# Patient Record
Sex: Female | Born: 1985 | Race: Black or African American | Hispanic: No | Marital: Single | Smoking: Never smoker
Health system: Southern US, Community
[De-identification: ages and names within clinical notes are randomized; demographics above are authoritative.]

## PROBLEM LIST (undated history)

## (undated) DIAGNOSIS — T7840XA Allergy, unspecified, initial encounter: Secondary | ICD-10-CM

## (undated) DIAGNOSIS — E282 Polycystic ovarian syndrome: Secondary | ICD-10-CM

## (undated) HISTORY — PX: NO PAST SURGERIES: SHX2092

## (undated) HISTORY — DX: Allergy, unspecified, initial encounter: T78.40XA

---

## 2007-03-05 ENCOUNTER — Emergency Department: Payer: Self-pay | Admitting: Emergency Medicine

## 2007-04-29 ENCOUNTER — Emergency Department: Payer: Self-pay | Admitting: Emergency Medicine

## 2007-07-03 ENCOUNTER — Emergency Department: Payer: Self-pay | Admitting: Emergency Medicine

## 2007-09-12 ENCOUNTER — Emergency Department: Payer: Self-pay | Admitting: Emergency Medicine

## 2007-09-14 ENCOUNTER — Emergency Department: Payer: Self-pay | Admitting: Emergency Medicine

## 2007-09-18 ENCOUNTER — Emergency Department (HOSPITAL_COMMUNITY): Admission: EM | Admit: 2007-09-18 | Discharge: 2007-09-19 | Payer: Self-pay | Admitting: Emergency Medicine

## 2007-09-20 ENCOUNTER — Ambulatory Visit: Payer: Self-pay | Admitting: Unknown Physician Specialty

## 2007-09-26 ENCOUNTER — Ambulatory Visit: Payer: Self-pay | Admitting: Unknown Physician Specialty

## 2007-10-31 ENCOUNTER — Emergency Department: Payer: Self-pay | Admitting: Emergency Medicine

## 2007-12-08 ENCOUNTER — Emergency Department: Payer: Self-pay | Admitting: Emergency Medicine

## 2007-12-28 ENCOUNTER — Emergency Department: Payer: Self-pay | Admitting: Emergency Medicine

## 2008-02-04 ENCOUNTER — Emergency Department: Payer: Self-pay | Admitting: Emergency Medicine

## 2008-08-13 ENCOUNTER — Emergency Department: Payer: Self-pay | Admitting: Emergency Medicine

## 2009-01-30 ENCOUNTER — Emergency Department: Payer: Self-pay | Admitting: Internal Medicine

## 2009-03-27 ENCOUNTER — Ambulatory Visit: Payer: Self-pay | Admitting: General Practice

## 2009-04-24 ENCOUNTER — Ambulatory Visit: Payer: Self-pay | Admitting: General Practice

## 2009-06-25 ENCOUNTER — Emergency Department: Payer: Self-pay | Admitting: Emergency Medicine

## 2009-09-12 ENCOUNTER — Emergency Department: Payer: Self-pay | Admitting: Emergency Medicine

## 2010-06-21 ENCOUNTER — Emergency Department: Payer: Self-pay | Admitting: Emergency Medicine

## 2010-11-07 LAB — GC/CHLAMYDIA PROBE AMP, GENITAL
Chlamydia, DNA Probe: NEGATIVE
GC Probe Amp, Genital: NEGATIVE

## 2010-11-07 LAB — DIFFERENTIAL
Basophils Absolute: 0
Basophils Relative: 0
Eosinophils Absolute: 0.1
Eosinophils Relative: 1
Lymphocytes Relative: 42
Lymphs Abs: 3.9
Monocytes Absolute: 0.6
Monocytes Relative: 7
Neutro Abs: 4.6
Neutrophils Relative %: 50

## 2010-11-07 LAB — CBC
HCT: 35.7 — ABNORMAL LOW
Hemoglobin: 12.2
MCHC: 34.3
MCV: 93.6
Platelets: 275
RBC: 3.81 — ABNORMAL LOW
RDW: 14.4
WBC: 9.2

## 2010-11-07 LAB — WET PREP, GENITAL
Clue Cells Wet Prep HPF POC: NONE SEEN
Trich, Wet Prep: NONE SEEN
Yeast Wet Prep HPF POC: NONE SEEN

## 2010-11-07 LAB — URINALYSIS, ROUTINE W REFLEX MICROSCOPIC
Bilirubin Urine: NEGATIVE
Glucose, UA: NEGATIVE
Hgb urine dipstick: NEGATIVE
Ketones, ur: NEGATIVE
Nitrite: NEGATIVE
Protein, ur: NEGATIVE
Specific Gravity, Urine: 1.02
Urobilinogen, UA: 1
pH: 7

## 2010-11-07 LAB — HCG, QUANTITATIVE, PREGNANCY: hCG, Beta Chain, Quant, S: 3102 — ABNORMAL HIGH

## 2010-11-07 LAB — PREGNANCY, URINE: Preg Test, Ur: POSITIVE

## 2010-11-07 LAB — ABO/RH: ABO/RH(D): O POS

## 2013-03-21 ENCOUNTER — Emergency Department: Payer: Self-pay | Admitting: Emergency Medicine

## 2013-03-21 LAB — CBC
HCT: 35.3 % (ref 35.0–47.0)
HGB: 12.3 g/dL (ref 12.0–16.0)
MCH: 33.6 pg (ref 26.0–34.0)
MCHC: 34.7 g/dL (ref 32.0–36.0)
MCV: 97 fL (ref 80–100)
Platelet: 235 10*3/uL (ref 150–440)
RBC: 3.65 10*6/uL — ABNORMAL LOW (ref 3.80–5.20)
RDW: 14.1 % (ref 11.5–14.5)
WBC: 9.5 10*3/uL (ref 3.6–11.0)

## 2014-05-17 ENCOUNTER — Encounter: Payer: Self-pay | Admitting: Podiatry

## 2014-05-17 ENCOUNTER — Other Ambulatory Visit: Payer: Self-pay | Admitting: *Deleted

## 2014-05-17 ENCOUNTER — Ambulatory Visit (INDEPENDENT_AMBULATORY_CARE_PROVIDER_SITE_OTHER): Payer: Commercial Managed Care - PPO

## 2014-05-17 ENCOUNTER — Ambulatory Visit (INDEPENDENT_AMBULATORY_CARE_PROVIDER_SITE_OTHER): Payer: Commercial Managed Care - PPO | Admitting: Podiatry

## 2014-05-17 VITALS — BP 117/87 | HR 92 | Resp 16 | Ht 66.0 in | Wt 300.0 lb

## 2014-05-17 DIAGNOSIS — M722 Plantar fascial fibromatosis: Secondary | ICD-10-CM | POA: Diagnosis not present

## 2014-05-17 MED ORDER — TRIAMCINOLONE ACETONIDE 10 MG/ML IJ SUSP
10.0000 mg | Freq: Once | INTRAMUSCULAR | Status: AC
  Administered 2014-05-17: 10 mg

## 2014-05-17 MED ORDER — MELOXICAM 15 MG PO TABS: 15.0000 mg | ORAL_TABLET | Freq: Every day | ORAL | Status: DC

## 2014-05-17 NOTE — Patient Instructions (Signed)

## 2014-05-17 NOTE — Progress Notes (Signed)
   Subjective:    Patient ID: Beth Watts, female    DOB: [birth year], 29 y.o.   MRN: [id number]  HPI  29 year old female presents the office with complaints of bilateral heel pain with the right side greater than left. She states that his been ongoing for possibly 6 months. She states of prostate 3 years ago she was diagnosed with plantar fasciitis by her primary care physician. She states that she tried stretching activities and icing at that time which relieved the symptoms. Over the last 6 months she states the pain has been progressive. She states that she has pain to the heels in the morning or after periods of rest which is relieved by ambulation. She denies any numbness to the area. The pain does not wake her up at night. She denies any history of injury or trauma to the area and denies any change or increased activity the time of onset of symptoms. No other complaints at this time.  Review of Systems  HENT: Positive for congestion, ear pain and sinus pressure.   Musculoskeletal: Positive for back pain.  All other systems reviewed and are negative.      Objective:   Physical Exam AAO x3, NAD DP/PT pulses palpable bilaterally, CRT less than 3 seconds Protective sensation intact with Simms Weinstein monofilament, vibratory sensation intact, Achilles tendon reflex intact Tenderness to palpation overlying the plantar medial tubercle of the calcaneus to bilatertal heels at the insertion of the plantar fascia. There is mild pain along the course of the medial band of the plantar fascial within the arch of the foot. The plantar fascia appears intact. There is no pain with lateral compression of the calcaneus or pain the vibratory sensation. No pain on the posterior aspect of the calcaneus or along the course/insertion of the Achilles tendon. There is no overlying edema, erythema, increase in warmth. No other areas of tenderness palpation or pain with vibratory sensation to the foot/ankle.  Decrease in medial arch height upon weightbearing the equinus present. MMT 5/5, ROM WNL No open lesions or pre-ulcerative lesions are identified. No pain with calf compression, swelling, warmth, erythema.     Assessment & Plan:  29 year old female with bilateral plantar fasciitis, flatfoot -X-rays were obtained and reviewed the patient -Treatment options were discussed including alternatives, risks, complications -Patient elects to proceed with steroid injection into the bilateral heels. Under sterile skin preparation, a total of 2.5cc of kenalog 10, 0.5% Marcaine plain, and 2% lidocaine plain were infiltrated into the symptomatic area without complication. A band-aid was applied. Patient tolerated the injection well without complication. Post-injection care with discussed with the patient. Discussed with the patient to ice the area over the next couple of days to help prevent a steroid flare.  -Plantar fascial brace was dispensed 2 -Discussed ice and stretching activities -Discussed shoe gear modifications. I do not do barefoot even at home. -Discussed orthotics. This time should proceed with custom orthotics. The patient was scanned for [facility] -Follow-up in 3 weeks or sooner if any problems are to arise. In the meantime encouraged to call the office with any questions, concerns, changes symptoms. Follow-up with PCP for other issues mentioned in the review of systems.

## 2014-06-07 ENCOUNTER — Ambulatory Visit: Payer: Commercial Managed Care - PPO | Admitting: Podiatry

## 2014-12-19 ENCOUNTER — Inpatient Hospital Stay (HOSPITAL_COMMUNITY)
Admission: AD | Admit: 2014-12-19 | Discharge: 2014-12-20 | Disposition: A | Discharge status: Home / Self Care | Payer: 59 | Source: Ambulatory Visit | Attending: Obstetrics & Gynecology | Admitting: Obstetrics & Gynecology

## 2014-12-19 ENCOUNTER — Encounter (HOSPITAL_COMMUNITY): Payer: Self-pay | Admitting: *Deleted

## 2014-12-19 DIAGNOSIS — N926 Irregular menstruation, unspecified: Secondary | ICD-10-CM | POA: Diagnosis not present

## 2014-12-19 DIAGNOSIS — N939 Abnormal uterine and vaginal bleeding, unspecified: Secondary | ICD-10-CM

## 2014-12-19 DIAGNOSIS — R319 Hematuria, unspecified: Secondary | ICD-10-CM | POA: Insufficient documentation

## 2014-12-19 HISTORY — DX: Polycystic ovarian syndrome: E28.2

## 2014-12-19 NOTE — MAU Note (Signed)
Pt states she has been having bleeding when she urinates and is cramping for 2 weeks.  She also complains of swelling in her ankles and feet.  Pt states she gets out of breath quickly.

## 2014-12-20 DIAGNOSIS — N898 Other specified noninflammatory disorders of vagina: Secondary | ICD-10-CM

## 2014-12-20 LAB — CBC
HCT: 34.8 % — ABNORMAL LOW (ref 36.0–46.0)
Hemoglobin: 11.4 g/dL — ABNORMAL LOW (ref 12.0–15.0)
MCH: 30.1 pg (ref 26.0–34.0)
MCHC: 32.8 g/dL (ref 30.0–36.0)
MCV: 91.8 fL (ref 78.0–100.0)
Platelets: 310 10*3/uL (ref 150–400)
RBC: 3.79 MIL/uL — ABNORMAL LOW (ref 3.87–5.11)
RDW: 14.9 % (ref 11.5–15.5)
WBC: 7.8 10*3/uL (ref 4.0–10.5)

## 2014-12-20 LAB — URINALYSIS, ROUTINE W REFLEX MICROSCOPIC
Bilirubin Urine: NEGATIVE
Glucose, UA: NEGATIVE mg/dL
Ketones, ur: NEGATIVE mg/dL
Leukocytes, UA: NEGATIVE
Nitrite: NEGATIVE
Protein, ur: NEGATIVE mg/dL
Specific Gravity, Urine: 1.025 (ref 1.005–1.030)
Urobilinogen, UA: 0.2 mg/dL (ref 0.0–1.0)
pH: 6 (ref 5.0–8.0)

## 2014-12-20 LAB — GC/CHLAMYDIA PROBE AMP (~~LOC~~) NOT AT ARMC
Chlamydia: NEGATIVE
Neisseria Gonorrhea: NEGATIVE

## 2014-12-20 LAB — URINE MICROSCOPIC-ADD ON: WBC, UA: NONE SEEN WBC/hpf (ref <3)

## 2014-12-20 LAB — WET PREP, GENITAL
Clue Cells Wet Prep HPF POC: NONE SEEN
Trich, Wet Prep: NONE SEEN
WBC, Wet Prep HPF POC: NONE SEEN
Yeast Wet Prep HPF POC: NONE SEEN

## 2014-12-20 LAB — POCT PREGNANCY, URINE: Preg Test, Ur: NEGATIVE

## 2014-12-20 NOTE — MAU Provider Note (Signed)
History     CSN: [id number]  Arrival date and time: 12/19/14 2334   First Provider Initiated Contact with Patient 12/20/14 0036      Chief Complaint  Patient presents with  . Hematuria  . Leg Swelling   Vaginal Bleeding The patient's primary symptoms include vaginal bleeding. This is a new problem. The current episode started 1 to 4 weeks ago. The problem occurs intermittently. The problem has been unchanged. The pain is moderate. The problem affects both sides. She is not pregnant. Associated symptoms include abdominal pain and urgency. Pertinent negatives include no constipation, diarrhea, dysuria, fever, frequency, nausea or vomiting. The vaginal discharge was bloody. The vaginal bleeding is spotting (only present when she voids. Unsure if blood is in her urine or vaginal bleeding. ). She has not been passing clots. She has not been passing tissue. Nothing aggravates the symptoms. She has tried nothing for the symptoms. She uses nothing for contraception.   Past Medical History  Diagnosis Date  . Allergy   . PCOS (polycystic ovarian syndrome)     Past Surgical History  Procedure Laterality Date  . No past surgeries      History reviewed. No pertinent family history.  Social History  Substance Use Topics  . Smoking status: Never Smoker   . Smokeless tobacco: None  . Alcohol Use: 0.0 oz/week    0 Standard drinks or equivalent per week    Allergies:  Allergies  Allergen Reactions  . Latex Rash    Prescriptions prior to admission  Medication Sig Dispense Refill Last Dose  . amoxicillin (AMOXIL) 500 MG capsule      . meloxicam (MOBIC) 15 MG tablet Take 1 tablet (15 mg total) by mouth daily. 30 tablet 2     Review of Systems  Constitutional: Negative for fever.  Gastrointestinal: Positive for abdominal pain. Negative for nausea, vomiting, diarrhea and constipation.  Genitourinary: Positive for urgency and vaginal bleeding. Negative for dysuria and frequency.    Physical Exam   Blood pressure 117/61, pulse 76, temperature 97.9 F (36.6 C), temperature source Oral, resp. rate 18, last menstrual period 11/28/2014.  Physical Exam  Nursing note and vitals reviewed. Constitutional: She is oriented to person, place, and time. She appears well-developed and well-nourished. No distress.  HENT:  Head: Normocephalic.  Cardiovascular: Normal rate.   Respiratory: Effort normal.  GI: Soft. There is no tenderness. There is no rebound.  Genitourinary:   External: no lesion Vagina: small amount of pink discharge Cervix: pink, smooth, no CMT Uterus: NSSC Adnexa: NT   Neurological: She is alert and oriented to person, place, and time.  Skin: Skin is warm and dry.  Psychiatric: She has a normal mood and affect.   Results for orders placed or performed during the hospital encounter of 12/19/14 (from the past 24 hour(s))  Urinalysis, Routine w reflex microscopic (not [facility])     Status: Abnormal   Collection Time: 12/19/14 11:45 PM  Result Value Ref Range   Color, Urine YELLOW YELLOW   APPearance CLEAR CLEAR   Specific Gravity, Urine 1.025 1.005 - 1.030   pH 6.0 5.0 - 8.0   Glucose, UA NEGATIVE NEGATIVE mg/dL   Hgb urine dipstick TRACE (A) NEGATIVE   Bilirubin Urine NEGATIVE NEGATIVE   Ketones, ur NEGATIVE NEGATIVE mg/dL   Protein, ur NEGATIVE NEGATIVE mg/dL   Urobilinogen, UA 0.2 0.0 - 1.0 mg/dL   Nitrite NEGATIVE NEGATIVE   Leukocytes, UA NEGATIVE NEGATIVE  Urine microscopic-add on  Status: Abnormal   Collection Time: 12/19/14 11:45 PM  Result Value Ref Range   Squamous Epithelial / LPF FEW (A) RARE   WBC, UA  <3 WBC/hpf    NO FORMED ELEMENTS SEEN ON URINE MICROSCOPIC EXAMINATION   RBC / HPF 0-2 <3 RBC/hpf   Bacteria, UA FEW (A) RARE  Pregnancy, urine POC     Status: None   Collection Time: 12/20/14 12:05 AM  Result Value Ref Range   Preg Test, Ur NEGATIVE NEGATIVE  Wet prep, genital     Status: None   Collection Time: 12/20/14  12:38 AM  Result Value Ref Range   Yeast Wet Prep HPF POC NONE SEEN NONE SEEN   Trich, Wet Prep NONE SEEN NONE SEEN   Clue Cells Wet Prep HPF POC NONE SEEN NONE SEEN   WBC, Wet Prep HPF POC NONE SEEN NONE SEEN  CBC     Status: Abnormal   Collection Time: 12/20/14  1:07 AM  Result Value Ref Range   WBC 7.8 4.0 - 10.5 K/uL   RBC 3.79 (L) 3.87 - 5.11 MIL/uL   Hemoglobin 11.4 (L) 12.0 - 15.0 g/dL   HCT 14.7 (L) 82.9 - 56.2 %   MCV 91.8 78.0 - 100.0 fL   MCH 30.1 26.0 - 34.0 pg   MCHC 32.8 30.0 - 36.0 g/dL   RDW 13.0 86.5 - 78.4 %   Platelets 310 150 - 400 K/uL     MAU Course  Procedures  MDM   Assessment and Plan   1. Vaginal spotting   2. Irregular menstrual cycle    DC home Comfort measures reviewed  Bleeding precautions Menstrual calendar  RX: none  Return to MAU as needed   Follow-up Information    Please follow up.   Why:  If symptoms worsen   Contact information:   Your primary care doctor.         Tawnya Crook 12/20/2014, 12:39 AM

## 2014-12-21 LAB — URINE CULTURE: Culture: 8000

## 2014-12-21 LAB — RPR: RPR Ser Ql: NONREACTIVE

## 2014-12-21 LAB — HIV ANTIBODY (ROUTINE TESTING W REFLEX): HIV Screen 4th Generation wRfx: NONREACTIVE

## 2016-02-18 ENCOUNTER — Telehealth: Payer: Self-pay | Admitting: Podiatry

## 2016-02-18 NOTE — Telephone Encounter (Signed)
Left vm for pt to call our office about orthotics that are from 2016

## 2018-01-05 ENCOUNTER — Encounter (HOSPITAL_COMMUNITY): Payer: Self-pay

## 2018-01-05 ENCOUNTER — Ambulatory Visit (HOSPITAL_COMMUNITY)
Admission: EM | Admit: 2018-01-05 | Discharge: 2018-01-05 | Disposition: A | Discharge status: Home / Self Care | Payer: 59 | Attending: Family Medicine | Admitting: Family Medicine

## 2018-01-05 DIAGNOSIS — G43009 Migraine without aura, not intractable, without status migrainosus: Secondary | ICD-10-CM

## 2018-01-05 MED ORDER — SUMATRIPTAN SUCCINATE 6 MG/0.5ML ~~LOC~~ SOLN
SUBCUTANEOUS | Status: AC
  Filled 2018-01-05: qty 0.5

## 2018-01-05 MED ORDER — KETOROLAC TROMETHAMINE 60 MG/2ML IM SOLN
INTRAMUSCULAR | Status: AC
  Filled 2018-01-05: qty 2

## 2018-01-05 MED ORDER — SUMATRIPTAN SUCCINATE 6 MG/0.5ML ~~LOC~~ SOLN
6.0000 mg | Freq: Once | SUBCUTANEOUS | Status: AC
  Administered 2018-01-05: 6 mg via SUBCUTANEOUS

## 2018-01-05 MED ORDER — ONDANSETRON 4 MG PO TBDP
ORAL_TABLET | ORAL | Status: AC
  Filled 2018-01-05: qty 1

## 2018-01-05 MED ORDER — KETOROLAC TROMETHAMINE 60 MG/2ML IM SOLN
60.0000 mg | Freq: Once | INTRAMUSCULAR | Status: AC
  Administered 2018-01-05: 60 mg via INTRAMUSCULAR

## 2018-01-05 MED ORDER — ONDANSETRON 4 MG PO TBDP
4.0000 mg | ORAL_TABLET | Freq: Once | ORAL | Status: AC
  Administered 2018-01-05: 4 mg via ORAL

## 2018-01-05 NOTE — ED Triage Notes (Signed)
Pt present that she has a severe headache that started this am, she took [place] powder this am Along with a  Sumatriptan 50mg 

## 2018-01-05 NOTE — ED Provider Notes (Signed)
[facility]   [id number] 01/05/18 Arrival Time: 4098  ASSESSMENT & PLAN:  1. Migraine without aura and without status migrainosus, not intractable    Meds ordered this encounter  Medications  . ketorolac (TORADOL) injection 60 mg  . SUMAtriptan (IMITREX) injection 6 mg  . ondansetron (ZOFRAN-ODT) disintegrating tablet 4 mg   Reports that she is beginning to feel better after above medications given. No sign of neurological emergency. No indication for hospital evaluation at this time. Normal neurological exam. Reassured. D/C to home for rest and observation. Headache precautions given on AVS.  Follow-up Information    [facility] EMERGENCY DEPARTMENT.   Specialty:  Emergency Medicine Why:  If your headache does not go away or if your headache worsens. Contact information: [address] [id number] mc [place] [address] [phone]         Reviewed expectations re: course of current medical issues. Questions answered. Outlined signs and symptoms indicating need for more acute intervention. Patient verbalized understanding. After Visit Summary given.   SUBJECTIVE:  Beth Watts is a 32 y.o. female who presents with complaint of a migraine without aura. Reports fairly abrupt onset this morning after wakin. Location: frontal (L>R) without radiation. History of headaches: yes; occasional migraines that usu respond to sumatriptan and OTC Goody powder and rest/sleep. Took these this morning but unable to fall asleep. Precipitating factors include: none which have been determined. Associated symptoms: Preceding aura: no. Nausea/vomiting: mild nausea without emesis. Vision changes: initially her L eye was blurry but this resolved quickly and has not returned. Increased sensitivity to light and to noises: yes. Fever: no. Sinus pressure/congestion: no. No extremity sensation changes or weakness. Current headache does limit  normal daily activities; could not go to work. Ambulatory without difficulty. Drove herself here. The patient denies depression, dizziness, loss of balance, muscle weakness and speech difficulties. No head injuries reported. Overall these symptoms typical of her previous migraines, "just worse." Not the worst headache of her life.  ROS: As per HPI. All other systems negative.   OBJECTIVE:  Vitals:   01/05/18 0959  BP: 120/76  Pulse: 80  Resp: 18  Temp: (!) 97.5 F (36.4 C)  TempSrc: Oral  SpO2: 99%    General appearance: alert; no fatigued (prefers to sit in darkened room) Eyes: PERRLA; EOMI; conjunctiva normal HENT: normocephalic; atraumatic Neck: supple with FROM Lungs: clear to auscultation bilaterally Heart: regular rate and rhythm Extremities: no edema; symmetrical with no gross deformities Skin: warm and dry Neurologic: CN 2-12 grossly intact; normal gait; normal symmetric reflexes; normal extremity strength and sensation throughout Psychological: alert and cooperative; normal mood and affect  Allergies  Allergen Reactions  . Latex Rash    Past Medical History:  Diagnosis Date  . Allergy   . PCOS (polycystic ovarian syndrome)    Social History   Socioeconomic History  . Marital status: Single    Spouse name: Not on file  . Number of children: Not on file  . Years of education: Not on file  . Highest education level: Not on file  Occupational History  . Not on file  Social Needs  . Financial resource strain: Not on file  . Food insecurity:    Worry: Not on file    Inability: Not on file  . Transportation needs:    Medical: Not on file    Non-medical: Not on file  Tobacco Use  . Smoking status: Never Smoker  . Smokeless tobacco: Never Used  Substance and Sexual Activity  . Alcohol use: Yes    Alcohol/week: 0.0 standard drinks  . Drug use: No  . Sexual activity: Not on file  Lifestyle  . Physical activity:    Days per week: Not on file     Minutes per session: Not on file  . Stress: Not on file  Relationships  . Social connections:    Talks on phone: Not on file    Gets together: Not on file    Attends religious service: Not on file    Active member of club or organization: Not on file    Attends meetings of clubs or organizations: Not on file    Relationship status: Not on file  . Intimate partner violence:    Fear of current or ex partner: Not on file    Emotionally abused: Not on file    Physically abused: Not on file    Forced sexual activity: Not on file  Other Topics Concern  . Not on file  Social History Narrative  . Not on file   Family History  Problem Relation Age of Onset  . Migraines Father   . Aneurysm Father    Past Surgical History:  Procedure Laterality Date  . NO PAST SURGERIES       Mardella LaymanHagler, , MD 01/05/18 1140

## 2018-01-05 NOTE — Discharge Instructions (Signed)
Please seek prompt medical care if: You have: A very bad (severe) headache that is not helped by medicines given today. Trouble walking or weakness in your arms and legs. Clear or bloody fluid coming from your nose or ears. Changes in your seeing (vision). Jerky movements that you cannot control (seizure). You throw up (vomit). Your symptoms get worse. You lose balance. Your speech is slurred. You pass out. You are sleepier and have trouble staying awake. The black centers of your eyes (pupils) change in size.  These symptoms may be an emergency. Do not wait to see if the symptoms will go away. Get medical help right away. Call your local emergency services. Do not drive yourself to the hospital.

## 2018-08-24 ENCOUNTER — Other Ambulatory Visit: Payer: Self-pay

## 2018-08-24 DIAGNOSIS — Z20822 Contact with and (suspected) exposure to covid-19: Secondary | ICD-10-CM

## 2018-08-28 LAB — NOVEL CORONAVIRUS, NAA: SARS-CoV-2, NAA: NOT DETECTED

## 2020-10-25 ENCOUNTER — Other Ambulatory Visit: Payer: Self-pay

## 2020-10-25 ENCOUNTER — Emergency Department (HOSPITAL_BASED_OUTPATIENT_CLINIC_OR_DEPARTMENT_OTHER)
Admission: EM | Admit: 2020-10-25 | Discharge: 2020-10-25 | Disposition: A | Discharge status: Home / Self Care | Payer: 59 | Attending: Emergency Medicine | Admitting: Emergency Medicine

## 2020-10-25 ENCOUNTER — Emergency Department (HOSPITAL_BASED_OUTPATIENT_CLINIC_OR_DEPARTMENT_OTHER): Payer: 59

## 2020-10-25 ENCOUNTER — Encounter (HOSPITAL_BASED_OUTPATIENT_CLINIC_OR_DEPARTMENT_OTHER): Payer: Self-pay

## 2020-10-25 DIAGNOSIS — N9489 Other specified conditions associated with female genital organs and menstrual cycle: Secondary | ICD-10-CM | POA: Diagnosis not present

## 2020-10-25 DIAGNOSIS — R600 Localized edema: Secondary | ICD-10-CM | POA: Insufficient documentation

## 2020-10-25 DIAGNOSIS — Z91040 Latex allergy status: Secondary | ICD-10-CM | POA: Insufficient documentation

## 2020-10-25 DIAGNOSIS — R609 Edema, unspecified: Secondary | ICD-10-CM

## 2020-10-25 DIAGNOSIS — R21 Rash and other nonspecific skin eruption: Secondary | ICD-10-CM | POA: Insufficient documentation

## 2020-10-25 DIAGNOSIS — M7989 Other specified soft tissue disorders: Secondary | ICD-10-CM | POA: Diagnosis present

## 2020-10-25 LAB — CBC WITH DIFFERENTIAL/PLATELET
Abs Immature Granulocytes: 0.03 10*3/uL (ref 0.00–0.07)
Basophils Absolute: 0 10*3/uL (ref 0.0–0.1)
Basophils Relative: 1 %
Eosinophils Absolute: 0.1 10*3/uL (ref 0.0–0.5)
Eosinophils Relative: 1 %
HCT: 39.3 % (ref 36.0–46.0)
Hemoglobin: 13.1 g/dL (ref 12.0–15.0)
Immature Granulocytes: 0 %
Lymphocytes Relative: 43 %
Lymphs Abs: 2.9 10*3/uL (ref 0.7–4.0)
MCH: 31.3 pg (ref 26.0–34.0)
MCHC: 33.3 g/dL (ref 30.0–36.0)
MCV: 94 fL (ref 80.0–100.0)
Monocytes Absolute: 0.5 10*3/uL (ref 0.1–1.0)
Monocytes Relative: 7 %
Neutro Abs: 3.3 10*3/uL (ref 1.7–7.7)
Neutrophils Relative %: 48 %
Platelets: 262 10*3/uL (ref 150–400)
RBC: 4.18 MIL/uL (ref 3.87–5.11)
RDW: 14.3 % (ref 11.5–15.5)
WBC: 6.9 10*3/uL (ref 4.0–10.5)
nRBC: 0 % (ref 0.0–0.2)

## 2020-10-25 LAB — HCG, QUANTITATIVE, PREGNANCY: hCG, Beta Chain, Quant, S: <1 m[IU]/mL (ref <5)

## 2020-10-25 LAB — BASIC METABOLIC PANEL
Anion gap: 8 (ref 5–15)
BUN: 10 mg/dL (ref 6–20)
CO2: 27 mmol/L (ref 22–32)
Calcium: 9.5 mg/dL (ref 8.9–10.3)
Chloride: 102 mmol/L (ref 98–111)
Creatinine, Ser: 0.78 mg/dL (ref 0.44–1.00)
GFR, Estimated: >60 mL/min (ref >60)
Glucose, Bld: 84 mg/dL (ref 70–99)
Potassium: 3.8 mmol/L (ref 3.5–5.1)
Sodium: 137 mmol/L (ref 135–145)

## 2020-10-25 NOTE — ED Notes (Signed)
Discharge instructions discussed with pt. Pt verbalized understanding. Pt stable and ambulatory.  °

## 2020-10-25 NOTE — Discharge Instructions (Addendum)
You have been evaluated for your leg swelling.  Fortunately no evidence of blood clot noted on today's exam.  Your labs are reassuring.  Please wear compressive hosing to help with the swelling, keep legs elevated while resting, and follow-up with your doctor for further care.

## 2020-10-25 NOTE — ED Provider Notes (Signed)
[facility] EMERGENCY DEPT Provider Note   CSN: [id number] Arrival date & time: 10/25/20  1741     History Chief Complaint  Patient presents with   Leg Swelling   Rash    Beth Watts is a 35 y.o. female.  The history is provided by the patient. No language interpreter was used.  Rash  35 year old female significant history of PCOS who presents with concerns of leg swelling.  Patient report a month ago she accidentally struck the edge of a coffee table against her right lower extremity.  She endorsed some mild discomfort there but it did not cause any significant symptoms.  However for the past 2 weeks and she noticed increasing swelling to both legs right greater than left.  She endorses a tightness sensation to her right calf.  She tries wearing compressive hose and with minimal improvement.  She denies any associated fever chest pain shortness of breath productive cough or hemoptysis.  No back pain.  No urinary changes.  No prior history of PE or DVT but did report there is family history of DVT.  She denies being on birth control, recent surgery, prolonged bedrest, or active cancer.  Past Medical History:  Diagnosis Date   Allergy    PCOS (polycystic ovarian syndrome)     There are no problems to display for this patient.   Past Surgical History:  Procedure Laterality Date   NO PAST SURGERIES       OB History     Gravida  1   Para      Term      Preterm      AB  1   Living         SAB  1   IAB      Ectopic      Multiple      Live Births              Family History  Problem Relation Age of Onset   Migraines Father    Aneurysm Father     Social History   Tobacco Use   Smoking status: Never   Smokeless tobacco: Never  Substance Use Topics   Alcohol use: Yes    Alcohol/week: 0.0 standard drinks   Drug use: No    Home Medications Prior to Admission medications   Not on File    Allergies    Latex  Review of  Systems   Review of Systems  Skin:  Positive for rash.  All other systems reviewed and are negative.  Physical Exam Updated Vital Signs BP (!) 143/88 (BP Location: Right Arm)   Pulse 78   Temp 98.3 F (36.8 C) (Oral)   Resp 20   Ht 5\' 6"  (1.676 m)   Wt (!) 145.2 kg   SpO2 100%   BMI 51.65 kg/m   Physical Exam Vitals and nursing note reviewed.  Constitutional:      General: She is not in acute distress.    Appearance: She is well-developed. She is obese.     Comments: Morbidly obese  HENT:     Head: Atraumatic.  Eyes:     Conjunctiva/sclera: Conjunctivae normal.  Cardiovascular:     Rate and Rhythm: Normal rate and regular rhythm.     Pulses: Normal pulses.     Heart sounds: Normal heart sounds.  Pulmonary:     Effort: Pulmonary effort is normal.     Breath sounds: No wheezing, rhonchi or rales.  Abdominal:  Palpations: Abdomen is soft.  Musculoskeletal:     Cervical back: Neck supple.     Right lower leg: Edema present.     Left lower leg: Edema present.     Comments: Nonpitting edema noted to bilateral lower extremities without any overlying skin changes.  Intact pedal pulses bilaterally.  Calves are soft and nontender.  Skin:    Findings: No rash.  Neurological:     Mental Status: She is alert.  Psychiatric:        Mood and Affect: Mood normal.    ED Results / Procedures / Treatments   Labs (all labs ordered are listed, but only abnormal results are displayed) Labs Reviewed  BASIC METABOLIC PANEL  CBC WITH DIFFERENTIAL/PLATELET  HCG, QUANTITATIVE, PREGNANCY    EKG None  Radiology No results found.  Procedures Procedures   Medications Ordered in ED Medications - No data to display  ED Course  I have reviewed the triage vital signs and the nursing notes.  Pertinent labs & imaging results that were available during my care of the patient were reviewed by me and considered in my medical decision making (see chart for details).    MDM  Rules/Calculators/A&P                           BP (!) 143/88 (BP Location: Right Arm)   Pulse 78   Temp 98.3 F (36.8 C) (Oral)   Resp 20   Ht 5\' 6"  (1.676 m)   Wt (!) 145.2 kg   SpO2 100%   BMI 51.65 kg/m   Final Clinical Impression(s) / ED Diagnoses Final diagnoses:  Peripheral edema    Rx / DC Orders ED Discharge Orders     None      6:20 PM Patient here with concerns of bilateral leg swelling right greater than left for the past several weeks.  Also report family history of DVT.  She does not have any other significant risk factor for DVT.  No chest pain or shortness of breath therefore low suspicion for PE.  Will obtain vascular ultrasound to rule out DVT.  We will check basic labs as well as kidney function.  7:31 PM Labs are reassuring, no evidence of kidney injury, normal WBC, normal H&H, normal electrolytes.  Pregnancy test is negative.  Vascular ultrasounds of the right lower extremity without evidence of DVT.  No evidence to suggest cellulitis either.  Her lungs are clear, doubt CHF.  Recommend compressive hose stockings to help with the swelling and outpatient follow-up.   , PA-C 10/25/20 1937    10/27/20, DO 10/25/20 2333

## 2020-10-25 NOTE — ED Triage Notes (Signed)
Bilateral lower extremity leg swelling x 2 weeks, pain in right lower extremity.  Darkened rash to anterior neck x "at least 2 weeks".

## 2020-12-21 ENCOUNTER — Other Ambulatory Visit: Payer: Self-pay

## 2020-12-21 ENCOUNTER — Encounter (HOSPITAL_BASED_OUTPATIENT_CLINIC_OR_DEPARTMENT_OTHER): Payer: Self-pay | Admitting: Emergency Medicine

## 2020-12-21 ENCOUNTER — Emergency Department (HOSPITAL_BASED_OUTPATIENT_CLINIC_OR_DEPARTMENT_OTHER): Payer: 59 | Admitting: Radiology

## 2020-12-21 ENCOUNTER — Emergency Department (HOSPITAL_BASED_OUTPATIENT_CLINIC_OR_DEPARTMENT_OTHER)
Admission: EM | Admit: 2020-12-21 | Discharge: 2020-12-21 | Disposition: A | Discharge status: Home / Self Care | Payer: 59 | Attending: Emergency Medicine | Admitting: Emergency Medicine

## 2020-12-21 DIAGNOSIS — Z91040 Latex allergy status: Secondary | ICD-10-CM | POA: Diagnosis not present

## 2020-12-21 DIAGNOSIS — Z20822 Contact with and (suspected) exposure to covid-19: Secondary | ICD-10-CM | POA: Insufficient documentation

## 2020-12-21 DIAGNOSIS — R059 Cough, unspecified: Secondary | ICD-10-CM

## 2020-12-21 DIAGNOSIS — J019 Acute sinusitis, unspecified: Secondary | ICD-10-CM | POA: Diagnosis not present

## 2020-12-21 LAB — RESP PANEL BY RT-PCR (FLU A&B, COVID) ARPGX2
Influenza A by PCR: NEGATIVE
Influenza B by PCR: NEGATIVE
SARS Coronavirus 2 by RT PCR: NEGATIVE

## 2020-12-21 MED ORDER — CEFDINIR 300 MG PO CAPS: 300.0000 mg | ORAL_CAPSULE | Freq: Two times a day (BID) | ORAL | 0 refills | Status: DC

## 2020-12-21 MED ORDER — ONDANSETRON 4 MG PO TBDP: 4.0000 mg | ORAL_TABLET | Freq: Three times a day (TID) | ORAL | 0 refills | Status: DC | PRN

## 2020-12-21 MED ORDER — ONDANSETRON 4 MG PO TBDP
4.0000 mg | ORAL_TABLET | Freq: Once | ORAL | Status: AC
  Administered 2020-12-21: 4 mg via ORAL
  Filled 2020-12-21: qty 1

## 2020-12-21 MED ORDER — CEFDINIR 300 MG PO CAPS: 300.0000 mg | ORAL_CAPSULE | Freq: Two times a day (BID) | ORAL | 0 refills | Status: AC

## 2020-12-21 MED ORDER — ONDANSETRON 4 MG PO TBDP: 4.0000 mg | ORAL_TABLET | Freq: Three times a day (TID) | ORAL | 0 refills | Status: AC | PRN

## 2020-12-21 NOTE — Discharge Instructions (Addendum)
Take nausea medication 20 minutes before antibiotic

## 2020-12-21 NOTE — ED Triage Notes (Signed)
Pt presents to ED Pov. Pt c/o cough, fever, sore throat x1w. Pt reports that she was taking antibiotics for strep but began throwing them up.

## 2020-12-22 NOTE — ED Provider Notes (Signed)
[facility] EMERGENCY DEPT Provider Note   CSN: [id number] Arrival date & time: 12/21/20  1205     History Chief Complaint  Patient presents with   Cough    Beth Watts is a 35 y.o. female.  The history is provided by the patient. No language interpreter was used.  Cough Cough characteristics:  Productive Sputum characteristics:  Yellow Severity:  Moderate Onset quality:  Gradual Duration:  1 week Timing:  Constant Progression:  Worsening Chronicity:  New Pt is on amoxicillin for strep throat.  Pt complains of cough and congestion  Pt reports she is vomiting up amoxicillin      Past Medical History:  Diagnosis Date   Allergy    PCOS (polycystic ovarian syndrome)     There are no problems to display for this patient.   Past Surgical History:  Procedure Laterality Date   NO PAST SURGERIES       OB History     Gravida  1   Para      Term      Preterm      AB  1   Living         SAB  1   IAB      Ectopic      Multiple      Live Births              Family History  Problem Relation Age of Onset   Migraines Father    Aneurysm Father     Social History   Tobacco Use   Smoking status: Never   Smokeless tobacco: Never  Substance Use Topics   Alcohol use: Yes    Alcohol/week: 0.0 standard drinks   Drug use: No    Home Medications Prior to Admission medications   Medication Sig Start Date End Date Taking? Authorizing Provider  cefdinir (OMNICEF) 300 MG capsule Take 1 capsule (300 mg total) by mouth 2 (two) times daily. 12/21/20   Elson Areas, PA-C  ondansetron (ZOFRAN ODT) 4 MG disintegrating tablet Take 1 tablet (4 mg total) by mouth every 8 (eight) hours as needed for nausea or vomiting. 12/21/20   Elson Areas, PA-C    Allergies    Latex  Review of Systems   Review of Systems  Respiratory:  Positive for cough.   All other systems reviewed and are negative.  Physical Exam Updated Vital  Signs BP (!) 109/59 (BP Location: Right Wrist)   Pulse 64   Temp 97.8 F (36.6 C) (Oral)   Resp 18   Ht 5\' 7"  (1.702 m)   Wt (!) 149.7 kg   LMP 12/05/2020   SpO2 100%   BMI 51.69 kg/m   Physical Exam Constitutional:      Appearance: She is well-developed.  HENT:     Head: Normocephalic and atraumatic.     Right Ear: Tympanic membrane normal.     Left Ear: Tympanic membrane normal.     Nose: Nose normal.     Mouth/Throat:     Mouth: Mucous membranes are moist.     Pharynx: Posterior oropharyngeal erythema present.  Eyes:     Conjunctiva/sclera: Conjunctivae normal.     Pupils: Pupils are equal, round, and reactive to light.  Cardiovascular:     Rate and Rhythm: Normal rate.     Pulses: Normal pulses.  Pulmonary:     Effort: Pulmonary effort is normal.  Abdominal:     General: Abdomen is flat.  Palpations: Abdomen is soft.  Musculoskeletal:        General: Normal range of motion.     Cervical back: Normal range of motion and neck supple.  Skin:    General: Skin is warm and dry.  Neurological:     General: No focal deficit present.     Mental Status: She is alert and oriented to person, place, and time.    ED Results / Procedures / Treatments   Labs (all labs ordered are listed, but only abnormal results are displayed) Labs Reviewed  RESP PANEL BY RT-PCR (FLU A&B, COVID) ARPGX2    EKG None  Radiology DG Chest 2 View  Result Date: 12/21/2020 CLINICAL DATA:  Cough, congestion and fever. EXAM: CHEST - 2 VIEW COMPARISON:  Jun 21, 2010 FINDINGS: The heart size and mediastinal contours are within normal limits. Both lungs are clear. The visualized skeletal structures are stable. IMPRESSION: No active cardiopulmonary disease. Electronically Signed   By: Sherian Rein M.D.   On: 12/21/2020 14:29    Procedures Procedures   Medications Ordered in ED Medications  ondansetron (ZOFRAN-ODT) disintegrating tablet 4 mg (4 mg Oral Given 12/21/20 1435)    ED Course   I have reviewed the triage vital signs and the nursing notes.  Pertinent labs & imaging results that were available during my care of the patient were reviewed by me and considered in my medical decision making (see chart for details).    MDM Rules/Calculators/A&P                           MDM: Pt given rx for omnicef and zofran.  Pt advised to stop amoxicillian   Final Clinical Impression(s) / ED Diagnoses Final diagnoses:  Acute sinusitis, recurrence not specified, unspecified location    Rx / DC Orders ED Discharge Orders          Ordered    ondansetron (ZOFRAN ODT) 4 MG disintegrating tablet  Every 8 hours PRN,   Status:  Discontinued        12/21/20 1459    cefdinir (OMNICEF) 300 MG capsule  2 times daily,   Status:  Discontinued        12/21/20 1500    cefdinir (OMNICEF) 300 MG capsule  2 times daily        12/21/20 1528    ondansetron (ZOFRAN ODT) 4 MG disintegrating tablet  Every 8 hours PRN        12/21/20 1528          An After Visit Summary was printed and given to the patient.    Elson Areas, PA-C 12/22/20 1405    Pricilla Loveless, MD 12/22/20 [phone]

## 2022-04-24 ENCOUNTER — Emergency Department (HOSPITAL_BASED_OUTPATIENT_CLINIC_OR_DEPARTMENT_OTHER)
Admission: EM | Admit: 2022-04-24 | Discharge: 2022-04-24 | Disposition: A | Discharge status: Home / Self Care | Attending: Emergency Medicine | Admitting: Emergency Medicine

## 2022-04-24 ENCOUNTER — Encounter (HOSPITAL_BASED_OUTPATIENT_CLINIC_OR_DEPARTMENT_OTHER): Payer: Self-pay | Admitting: Emergency Medicine

## 2022-04-24 ENCOUNTER — Other Ambulatory Visit: Payer: Self-pay

## 2022-04-24 DIAGNOSIS — Y990 Civilian activity done for income or pay: Secondary | ICD-10-CM | POA: Insufficient documentation

## 2022-04-24 DIAGNOSIS — W3189XA Contact with other specified machinery, initial encounter: Secondary | ICD-10-CM | POA: Diagnosis not present

## 2022-04-24 DIAGNOSIS — T148XXA Other injury of unspecified body region, initial encounter: Secondary | ICD-10-CM

## 2022-04-24 DIAGNOSIS — Z91040 Latex allergy status: Secondary | ICD-10-CM | POA: Diagnosis not present

## 2022-04-24 DIAGNOSIS — S6991XA Unspecified injury of right wrist, hand and finger(s), initial encounter: Secondary | ICD-10-CM | POA: Diagnosis present

## 2022-04-24 DIAGNOSIS — S61210A Laceration without foreign body of right index finger without damage to nail, initial encounter: Secondary | ICD-10-CM | POA: Insufficient documentation

## 2022-04-24 MED ORDER — LIDOCAINE-EPINEPHRINE (PF) 2 %-1:200000 IJ SOLN
10.0000 mL | Freq: Once | INTRAMUSCULAR | Status: DC
  Filled 2022-04-24: qty 20

## 2022-04-24 NOTE — Discharge Instructions (Signed)
Apply an ointment a couple times a day to the area.  This can be an antibiotic ointment if you choose or can be as simple as Vaseline.  Please return for redness drainage or if you develop a fever.

## 2022-04-24 NOTE — ED Triage Notes (Signed)
Laceration to R index finger from an instrument at work today.

## 2022-04-24 NOTE — ED Provider Notes (Signed)
[place] Provider Note   CSN: [id number] Arrival date & time: 04/24/22  [id number]     History  Chief Complaint  Patient presents with   Laceration    Beth Watts is a 37 y.o. female.  37 yo F with a chief complaints of injury to her finger.  She was using what sounds like a mandolin type device and ended up cutting the tip of her right second digit.  Was having trouble controlling the bleeding and came here for evaluation.  Last tetanus a couple years ago.   Laceration      Home Medications Prior to Admission medications   Medication Sig Start Date End Date Taking? Authorizing Provider  cefdinir (OMNICEF) 300 MG capsule Take 1 capsule (300 mg total) by mouth 2 (two) times daily. 12/21/20   Fransico Meadow, PA-C  ondansetron (ZOFRAN ODT) 4 MG disintegrating tablet Take 1 tablet (4 mg total) by mouth every 8 (eight) hours as needed for nausea or vomiting. 12/21/20   Fransico Meadow, PA-C      Allergies    Latex    Review of Systems   Review of Systems  Physical Exam Updated Vital Signs BP 136/89 (BP Location: Left Arm)   Pulse 72   Temp 98.4 F (36.9 C) (Oral)   Resp 16   LMP 04/07/2022   SpO2 100%  Physical Exam Vitals and nursing note reviewed.  Constitutional:      General: She is not in acute distress.    Appearance: She is well-developed. She is not diaphoretic.  HENT:     Head: Normocephalic and atraumatic.  Eyes:     Pupils: Pupils are equal, round, and reactive to light.  Cardiovascular:     Rate and Rhythm: Normal rate and regular rhythm.     Heart sounds: No murmur heard.    No friction rub. No gallop.  Pulmonary:     Effort: Pulmonary effort is normal.     Breath sounds: No wheezing or rales.  Abdominal:     General: There is no distension.     Palpations: Abdomen is soft.     Tenderness: There is no abdominal tenderness.  Musculoskeletal:        General: No tenderness.     Cervical back:  Normal range of motion and neck supple.     Comments: Avulsion to the fingertip approximately 0.5 cm in width.  No active bleeding.  No fingernail involvement.  Skin:    General: Skin is warm and dry.  Neurological:     Mental Status: She is alert and oriented to person, place, and time.  Psychiatric:        Behavior: Behavior normal.     ED Results / Procedures / Treatments   Labs (all labs ordered are listed, but only abnormal results are displayed) Labs Reviewed - No data to display  EKG None  Radiology No results found.  Procedures Procedures    Medications Ordered in ED Medications  lidocaine-EPINEPHrine (XYLOCAINE W/EPI) 2 %-1:200000 (PF) injection 10 mL (has no administration in time range)    ED Course/ Medical Decision Making/ A&P                             Medical Decision Making Risk Prescription drug management.   37 yo F with a chief complaints of an injury to the right second fingertip.  Patient has a small avulsion  injury.  Unable to approximate.  Will let it heal by second intention.  Tetanus up-to-date.  PCP follow-up.  8:41 AM:  I have discussed the diagnosis/risks/treatment options with the patient and family.  Evaluation and diagnostic testing in the emergency department does not suggest an emergent condition requiring admission or immediate intervention beyond what has been performed at this time.  They will follow up with PCP. We also discussed returning to the ED immediately if new or worsening sx occur. We discussed the sx which are most concerning (e.g., sudden worsening pain, fever, inability to tolerate by mouth) that necessitate immediate return. Medications administered to the patient during their visit and any new prescriptions provided to the patient are listed below.  Medications given during this visit Medications  lidocaine-EPINEPHrine (XYLOCAINE W/EPI) 2 %-1:200000 (PF) injection 10 mL (has no administration in time range)     The  patient appears reasonably screen and/or stabilized for discharge and I doubt any other medical condition or other [facility] requiring further screening, evaluation, or treatment in the ED at this time prior to discharge.            Final Clinical Impression(s) / ED Diagnoses Final diagnoses:  Skin avulsion    Rx / DC Orders ED Discharge Orders     None         Deno Etienne, DO 04/24/22 [id number]

## 2022-10-17 ENCOUNTER — Other Ambulatory Visit: Payer: Self-pay

## 2022-10-17 DIAGNOSIS — U071 COVID-19: Secondary | ICD-10-CM | POA: Insufficient documentation

## 2022-10-17 DIAGNOSIS — J3489 Other specified disorders of nose and nasal sinuses: Secondary | ICD-10-CM | POA: Diagnosis present

## 2022-10-17 DIAGNOSIS — Z91040 Latex allergy status: Secondary | ICD-10-CM | POA: Diagnosis not present

## 2022-10-17 DIAGNOSIS — M545 Low back pain, unspecified: Secondary | ICD-10-CM | POA: Diagnosis not present

## 2022-10-17 NOTE — ED Triage Notes (Signed)
Pt to triage c/o dysuria with cloudy urine with fever and chills x 1 week. Pt states she returned from cruise this morning and has had increased cough as well. PT VSS NAD PT on room air.

## 2022-10-18 ENCOUNTER — Emergency Department (HOSPITAL_BASED_OUTPATIENT_CLINIC_OR_DEPARTMENT_OTHER)
Admission: EM | Admit: 2022-10-18 | Discharge: 2022-10-18 | Disposition: A | Discharge status: Home / Self Care | Payer: BC Managed Care – PPO | Attending: Emergency Medicine | Admitting: Emergency Medicine

## 2022-10-18 DIAGNOSIS — U071 COVID-19: Secondary | ICD-10-CM

## 2022-10-18 DIAGNOSIS — M545 Low back pain, unspecified: Secondary | ICD-10-CM

## 2022-10-18 LAB — RESP PANEL BY RT-PCR (RSV, FLU A&B, COVID)  RVPGX2
Influenza A by PCR: NEGATIVE
Influenza B by PCR: NEGATIVE
Resp Syncytial Virus by PCR: NEGATIVE
SARS Coronavirus 2 by RT PCR: POSITIVE — AB

## 2022-10-18 LAB — URINALYSIS, ROUTINE W REFLEX MICROSCOPIC
Bilirubin Urine: NEGATIVE
Glucose, UA: NEGATIVE mg/dL
Hgb urine dipstick: NEGATIVE
Ketones, ur: NEGATIVE mg/dL
Leukocytes,Ua: NEGATIVE
Nitrite: NEGATIVE
Protein, ur: NEGATIVE mg/dL
Specific Gravity, Urine: 1.018 (ref 1.005–1.030)
pH: 7 (ref 5.0–8.0)

## 2022-10-18 MED ORDER — GUAIFENESIN-DM 100-10 MG/5ML PO SYRP: 5.0000 mL | ORAL_SOLUTION | ORAL | 0 refills | Status: AC | PRN

## 2022-10-18 MED ORDER — HYDROCODONE-ACETAMINOPHEN 5-325 MG PO TABS
1.0000 | ORAL_TABLET | Freq: Once | ORAL | Status: AC
  Administered 2022-10-18: 1 via ORAL
  Filled 2022-10-18: qty 1

## 2022-10-18 MED ORDER — IBUPROFEN 600 MG PO TABS: 600.0000 mg | ORAL_TABLET | Freq: Four times a day (QID) | ORAL | 0 refills | Status: AC | PRN

## 2022-10-18 MED ORDER — OXYMETAZOLINE HCL 0.05 % NA SOLN: 1.0000 | Freq: Two times a day (BID) | NASAL | 0 refills | Status: AC

## 2022-10-18 MED ORDER — LIDOCAINE 5 % EX PTCH: 1.0000 | MEDICATED_PATCH | Freq: Every day | CUTANEOUS | 0 refills | Status: AC | PRN

## 2022-10-18 MED ORDER — ACETAMINOPHEN 325 MG PO TABS: 650.0000 mg | ORAL_TABLET | Freq: Four times a day (QID) | ORAL | 0 refills | Status: AC | PRN

## 2022-10-18 MED ORDER — OXYCODONE HCL 5 MG PO TABS: 5.0000 mg | ORAL_TABLET | ORAL | 0 refills | Status: AC | PRN

## 2022-10-18 NOTE — ED Provider Notes (Signed)
[place] EMERGENCY DEPARTMENT AT [facility] Provider Note  CSN: [id number] Arrival date & time: 10/17/22 2336  Chief Complaint(s) Dysuria and Influenza  HPI Beth Watts is a 37 y.o. female with past medical history as below, significant for pcos obesity allergies who presents to the ED with complaint of uri symptoms, arthralgias, concern for ?uti.   She returned from a cruise this evening, she has been feeling unwell for the past 2 to 3 days, body aches, runny nose, congestion, nonproductive cough, sore throat, body aches, low back pain.  She has been taking antihistamine without much relief of her symptoms.  Subjective fevers, chills.  Unsure sick contacts.  No significant dyspnea or chest pain, no abdominal pain, nausea or vomiting.  Past Medical History Past Medical History:  Diagnosis Date   Allergy    PCOS (polycystic ovarian syndrome)    There are no problems to display for this patient.  Home Medication(s) Prior to Admission medications   Medication Sig Start Date End Date Taking? Authorizing Provider  acetaminophen (TYLENOL) 325 MG tablet Take 2 tablets (650 mg total) by mouth every 6 (six) hours as needed. 10/18/22  Yes Tanda Rockers A, DO  guaiFENesin-dextromethorphan (ROBITUSSIN DM) 100-10 MG/5ML syrup Take 5 mLs by mouth every 4 (four) hours as needed for cough. 10/18/22  Yes Tanda Rockers A, DO  ibuprofen (ADVIL) 600 MG tablet Take 1 tablet (600 mg total) by mouth every 6 (six) hours as needed. 10/18/22  Yes Tanda Rockers A, DO  lidocaine (LIDODERM) 5 % Place 1 patch onto the skin daily as needed. Remove & Discard patch within 12 hours or as directed by MD 10/18/22  Yes Tanda Rockers A, DO  oxyCODONE (ROXICODONE) 5 MG immediate release tablet Take 1 tablet (5 mg total) by mouth every 4 (four) hours as needed for severe pain. 10/18/22  Yes Sloan Leiter, DO  oxymetazoline (AFRIN NASAL SPRAY) 0.05 % nasal spray Place 1 spray into both nostrils 2 (two) times daily for 3  days. 10/18/22 10/21/22 Yes Tanda Rockers A, DO  cefdinir (OMNICEF) 300 MG capsule Take 1 capsule (300 mg total) by mouth 2 (two) times daily. 12/21/20   Elson Areas, PA-C  ondansetron (ZOFRAN ODT) 4 MG disintegrating tablet Take 1 tablet (4 mg total) by mouth every 8 (eight) hours as needed for nausea or vomiting. 12/21/20   Elson Areas, PA-C                                                                                                                                    Past Surgical History Past Surgical History:  Procedure Laterality Date   NO PAST SURGERIES     Family History Family History  Problem Relation Age of Onset   Migraines Father    Aneurysm Father     Social History Social History   Tobacco Use   Smoking status: Never   Smokeless tobacco:  Never  Substance Use Topics   Alcohol use: Yes    Alcohol/week: 0.0 standard drinks of alcohol   Drug use: No   Allergies Latex  Review of Systems Review of Systems  Constitutional:  Positive for appetite change, chills and fever.  HENT:  Positive for congestion, postnasal drip, rhinorrhea, sneezing and sore throat.   Eyes:  Negative for pain and redness.  Respiratory:  Positive for cough. Negative for chest tightness and shortness of breath.   Cardiovascular:  Negative for chest pain and palpitations.  Gastrointestinal:  Negative for abdominal pain, nausea and vomiting.  Endocrine: Negative for polyuria.  Genitourinary:        Dark-colored urine  Musculoskeletal:  Positive for arthralgias.  Neurological:  Negative for syncope and light-headedness.    Physical Exam Vital Signs  I have reviewed the triage vital signs BP (!) 138/90 (BP Location: Right Arm)   Pulse 99   Temp 99.7 F (37.6 C) (Oral)   Resp 18   Wt (!) 145.2 kg   LMP 08/24/2022 (Approximate)   SpO2 99%   BMI 50.12 kg/m  Physical Exam Vitals and nursing note reviewed.  Constitutional:      General: She is not in acute distress.     Appearance: Normal appearance. She is obese.  HENT:     Head: Normocephalic and atraumatic.     Right Ear: External ear normal.     Left Ear: External ear normal.     Nose: Nose normal.     Mouth/Throat:     Mouth: Mucous membranes are moist.  Eyes:     General: No scleral icterus.       Right eye: No discharge.        Left eye: No discharge.  Cardiovascular:     Rate and Rhythm: Normal rate and regular rhythm.     Pulses: Normal pulses.     Heart sounds: Normal heart sounds.  Pulmonary:     Effort: Pulmonary effort is normal. No respiratory distress.     Breath sounds: Normal breath sounds. No stridor.  Abdominal:     General: Abdomen is flat. There is no distension.     Palpations: Abdomen is soft.     Tenderness: There is no abdominal tenderness.  Musculoskeletal:       Arms:     Cervical back: No rigidity.     Right lower leg: No edema.     Left lower leg: No edema.     Comments: No midline spinous process tenderness to palpation or percussion, no crepitus or step-off.   Paraspinal TTP left lumbar  Skin:    General: Skin is warm and dry.     Capillary Refill: Capillary refill takes less than 2 seconds.  Neurological:     Mental Status: She is alert and oriented to person, place, and time.     GCS: GCS eye subscore is 4. GCS verbal subscore is 5. GCS motor subscore is 6.     Cranial Nerves: No dysarthria or facial asymmetry.     Motor: Motor function is intact. No tremor.     Coordination: Coordination is intact. Coordination normal.     Gait: Gait is intact.  Psychiatric:        Mood and Affect: Mood normal.        Behavior: Behavior normal. Behavior is cooperative.     ED Results and Treatments Labs (all labs ordered are listed, but only abnormal results are displayed) Labs Reviewed  RESP  PANEL BY RT-PCR (RSV, FLU A&B, COVID)  RVPGX2 - Abnormal; Notable for the following components:      Result Value   SARS Coronavirus 2 by RT PCR POSITIVE (*)    All other  components within normal limits  URINALYSIS, ROUTINE W REFLEX MICROSCOPIC                                                                                                                          Radiology No results found.  Pertinent labs & imaging results that were available during my care of the patient were reviewed by me and considered in my medical decision making (see MDM for details).  Medications Ordered in ED Medications  HYDROcodone-acetaminophen (NORCO/VICODIN) 5-325 MG per tablet 1 tablet (has no administration in time range)                                                                                                                                     Procedures Procedures  (including critical care time)  Medical Decision Making / ED Course    Medical Decision Making:    Fayeth Huckabay is a 37 y.o. female with history as above including PCOS, allergies here with URI symptoms, arthralgias, concern for possible UTI. The complaint involves an extensive differential diagnosis and also carries with it a high risk of complications and morbidity.  Serious etiology was considered. Ddx includes but is not limited to: Viral syndrome, UTI, pyelonephritis, pharyngitis, flu, COVID-19, muscle strain, etc. Differential diagnosis includes but is not exclusive to musculoskeletal back pain, renal colic, urinary tract infection, pyelonephritis, intra-abdominal causes of back pain, aortic aneurysm or dissection, cauda equina syndrome, sciatica, lumbar disc disease, thoracic disc disease, etc.    Complete initial physical exam performed, notably the patient  was no acute distress, low-grade fever, no hypoxia.    Reviewed and confirmed nursing documentation for past medical history, family history, social history.  Vital signs reviewed.        Well-appearing 37 year old female here with symptoms as above.  Found to be positive with COVID-19.  Urinalysis without evidence of infection  or blood.  She is TTP left lumbar.  Neuroexam is nonfocal, no saddle paresthesias or gait disturbance.  Favor likely MSK source of her back pain.  Muscle strain.    URI symptoms likely secondary to COVID-19.  Recommend supportive care at home, isolation precautions were discussed  She is not hypoxic, she is tolerating p.o. without difficulty  Stable for further outpatient management  She denies concern for pregnancy, does not want pregnancy testing, agreeable to medication without pregnancy test  Follow-up PCP in regards to low back pain.   Patient presents with low back pain without signs of spinal cord compression, cauda equina syndrome, infection, aneurysm, or other serious etiology. The patient is neurologically intact. Given the extremely low risk of these diagnoses further testing and evaluation for these possibilities does not appear to be indicated at this time.  The patient improved significantly and was discharged in stable condition. Detailed discussions were had with the patient regarding current findings, and need for close f/u with PCP or on call doctor. The patient has been instructed to return immediately if the symptoms worsen in any way for re-evaluation. Patient verbalized understanding and is in agreement with current care plan. All questions answered prior to discharge.              Additional history obtained: -Additional history obtained from na -External records from outside source obtained and reviewed including: Chart review including previous notes, labs, imaging, consultation notes including  Prior ED visits, home medications. pdmp   Lab Tests: -I ordered, reviewed, and interpreted labs.   The pertinent results include:   Labs Reviewed  RESP PANEL BY RT-PCR (RSV, FLU A&B, COVID)  RVPGX2 - Abnormal; Notable for the following components:      Result Value   SARS Coronavirus 2 by RT PCR POSITIVE (*)    All other components within normal limits   URINALYSIS, ROUTINE W REFLEX MICROSCOPIC    Notable for COVID-19 positive  EKG   EKG Interpretation Date/Time:    Ventricular Rate:    PR Interval:    QRS Duration:    QT Interval:    QTC Calculation:   R Axis:      Text Interpretation:           Imaging Studies ordered: na   Medicines ordered and prescription drug management: Meds ordered this encounter  Medications   HYDROcodone-acetaminophen (NORCO/VICODIN) 5-325 MG per tablet 1 tablet   oxyCODONE (ROXICODONE) 5 MG immediate release tablet    Sig: Take 1 tablet (5 mg total) by mouth every 4 (four) hours as needed for severe pain.    Dispense:  8 tablet    Refill:  0   acetaminophen (TYLENOL) 325 MG tablet    Sig: Take 2 tablets (650 mg total) by mouth every 6 (six) hours as needed.    Dispense:  36 tablet    Refill:  0   ibuprofen (ADVIL) 600 MG tablet    Sig: Take 1 tablet (600 mg total) by mouth every 6 (six) hours as needed.    Dispense:  30 tablet    Refill:  0   lidocaine (LIDODERM) 5 %    Sig: Place 1 patch onto the skin daily as needed. Remove & Discard patch within 12 hours or as directed by MD    Dispense:  15 patch    Refill:  0   guaiFENesin-dextromethorphan (ROBITUSSIN DM) 100-10 MG/5ML syrup    Sig: Take 5 mLs by mouth every 4 (four) hours as needed for cough.    Dispense:  118 mL    Refill:  0   oxymetazoline (AFRIN NASAL SPRAY) 0.05 % nasal spray    Sig: Place 1 spray into both nostrils 2 (two) times daily for 3 days.    Dispense:  15  mL    Refill:  0    -I have reviewed the patients home medicines and have made adjustments as needed   Consultations Obtained: na   Cardiac Monitoring: Continuous pulse oximetry interpreted by myself, 99% on RA.    Social Determinants of Health:  Diagnosis or treatment significantly limited by social determinants of health: obesity   Reevaluation: After the interventions noted above, I reevaluated the patient and found that they have  improved  Co morbidities that complicate the patient evaluation  Past Medical History:  Diagnosis Date   Allergy    PCOS (polycystic ovarian syndrome)       Dispostion: Disposition decision including need for hospitalization was considered, and patient discharged from emergency department.    Final Clinical Impression(s) / ED Diagnoses Final diagnoses:  COVID-19  Acute left-sided low back pain, unspecified whether sciatica present        Sloan Leiter, DO 10/18/22 [phone]

## 2022-10-18 NOTE — Discharge Instructions (Signed)
It was a pleasure caring for you today in the emergency department. ° °Please return to the emergency department for any worsening or worrisome symptoms. ° ° °

## 2022-10-18 NOTE — ED Notes (Signed)
Urine collected and sent to lab.

## 2022-10-30 ENCOUNTER — Encounter (HOSPITAL_BASED_OUTPATIENT_CLINIC_OR_DEPARTMENT_OTHER): Payer: Self-pay

## 2022-10-30 ENCOUNTER — Other Ambulatory Visit: Payer: Self-pay

## 2022-10-30 ENCOUNTER — Emergency Department (HOSPITAL_BASED_OUTPATIENT_CLINIC_OR_DEPARTMENT_OTHER)
Admission: EM | Admit: 2022-10-30 | Discharge: 2022-10-31 | Disposition: A | Discharge status: Home / Self Care | Payer: BC Managed Care – PPO | Attending: Emergency Medicine | Admitting: Emergency Medicine

## 2022-10-30 DIAGNOSIS — Z91040 Latex allergy status: Secondary | ICD-10-CM | POA: Diagnosis not present

## 2022-10-30 DIAGNOSIS — M5442 Lumbago with sciatica, left side: Secondary | ICD-10-CM | POA: Insufficient documentation

## 2022-10-30 DIAGNOSIS — M545 Low back pain, unspecified: Secondary | ICD-10-CM | POA: Diagnosis present

## 2022-10-30 DIAGNOSIS — M5432 Sciatica, left side: Secondary | ICD-10-CM

## 2022-10-30 LAB — BASIC METABOLIC PANEL
Anion gap: 7 (ref 5–15)
BUN: 17 mg/dL (ref 6–20)
CO2: 27 mmol/L (ref 22–32)
Calcium: 9.1 mg/dL (ref 8.9–10.3)
Chloride: 106 mmol/L (ref 98–111)
Creatinine, Ser: 0.9 mg/dL (ref 0.44–1.00)
GFR, Estimated: >60 mL/min (ref >60)
Glucose, Bld: 89 mg/dL (ref 70–99)
Potassium: 3.7 mmol/L (ref 3.5–5.1)
Sodium: 140 mmol/L (ref 135–145)

## 2022-10-30 LAB — CBC WITH DIFFERENTIAL/PLATELET
Abs Immature Granulocytes: 0.02 10*3/uL (ref 0.00–0.07)
Basophils Absolute: 0 10*3/uL (ref 0.0–0.1)
Basophils Relative: 0 %
Eosinophils Absolute: 0.1 10*3/uL (ref 0.0–0.5)
Eosinophils Relative: 1 %
HCT: 34.6 % — ABNORMAL LOW (ref 36.0–46.0)
Hemoglobin: 11.7 g/dL — ABNORMAL LOW (ref 12.0–15.0)
Immature Granulocytes: 0 %
Lymphocytes Relative: 49 %
Lymphs Abs: 3.4 10*3/uL (ref 0.7–4.0)
MCH: 31.8 pg (ref 26.0–34.0)
MCHC: 33.8 g/dL (ref 30.0–36.0)
MCV: 94 fL (ref 80.0–100.0)
Monocytes Absolute: 0.5 10*3/uL (ref 0.1–1.0)
Monocytes Relative: 7 %
Neutro Abs: 3 10*3/uL (ref 1.7–7.7)
Neutrophils Relative %: 43 %
Platelets: 280 10*3/uL (ref 150–400)
RBC: 3.68 MIL/uL — ABNORMAL LOW (ref 3.87–5.11)
RDW: 14.3 % (ref 11.5–15.5)
WBC: 7 10*3/uL (ref 4.0–10.5)
nRBC: 0 % (ref 0.0–0.2)

## 2022-10-30 LAB — URINALYSIS, ROUTINE W REFLEX MICROSCOPIC
Bacteria, UA: NONE SEEN
Bilirubin Urine: NEGATIVE
Glucose, UA: NEGATIVE mg/dL
Ketones, ur: NEGATIVE mg/dL
Leukocytes,Ua: NEGATIVE
Nitrite: NEGATIVE
RBC / HPF: >50 RBC/hpf (ref 0–5)
Specific Gravity, Urine: 1.026 (ref 1.005–1.030)
pH: 7.5 (ref 5.0–8.0)

## 2022-10-30 LAB — PREGNANCY, URINE: Preg Test, Ur: NEGATIVE

## 2022-10-30 MED ORDER — IBUPROFEN 800 MG PO TABS
800.0000 mg | ORAL_TABLET | ORAL | Status: AC
  Administered 2022-10-30: 800 mg via ORAL
  Filled 2022-10-30: qty 1

## 2022-10-30 NOTE — ED Triage Notes (Addendum)
Patient arrives POV c/o lower back that started "about 5 weeks ago." Patient states pain radiates down left leg. Patient states she took ibuprofen last night; pt reports increased pain on movement. Patient reports pain is 10/10; is sharp, stabbing, and constant. Patient states she used a lidocaine patch last night and removed it at 5 pm today. Patient denies UTI sxs. Pt is a& ox4.

## 2022-10-31 MED ORDER — METHOCARBAMOL 500 MG PO TABS: 500.0000 mg | ORAL_TABLET | Freq: Two times a day (BID) | ORAL | 0 refills | Status: AC

## 2022-10-31 MED ORDER — PREDNISONE 50 MG PO TABS
60.0000 mg | ORAL_TABLET | Freq: Once | ORAL | Status: AC
  Administered 2022-10-31: 60 mg via ORAL
  Filled 2022-10-31: qty 1

## 2022-10-31 MED ORDER — KETOROLAC TROMETHAMINE 30 MG/ML IJ SOLN
30.0000 mg | Freq: Once | INTRAMUSCULAR | Status: AC
  Administered 2022-10-31: 30 mg via INTRAMUSCULAR
  Filled 2022-10-31: qty 1

## 2022-10-31 MED ORDER — NAPROXEN 500 MG PO TABS: 500.0000 mg | ORAL_TABLET | Freq: Two times a day (BID) | ORAL | 0 refills | Status: AC

## 2022-10-31 MED ORDER — PREDNISONE 10 MG (21) PO TBPK: ORAL_TABLET | ORAL | 0 refills | Status: AC

## 2022-10-31 NOTE — ED Provider Notes (Signed)
[place] EMERGENCY DEPARTMENT AT [facility]  Provider Note  CSN: [id number] Arrival date & time: 10/30/22 1952  History Chief Complaint  Patient presents with   Back Pain    Beth Watts is a 37 y.o. female reports over a month of L lower back pain, radiates into her L leg. Worse with movement. Seen in the ED earlier this month for similar, also had Covid then. Given Rx for Norco which helped some. No bowel or bladder problems.    Home Medications Prior to Admission medications   Medication Sig Start Date End Date Taking? Authorizing Provider  methocarbamol (ROBAXIN) 500 MG tablet Take 1 tablet (500 mg total) by mouth 2 (two) times daily. 10/31/22  Yes Pollyann Savoy, MD  naproxen (NAPROSYN) 500 MG tablet Take 1 tablet (500 mg total) by mouth 2 (two) times daily. 10/31/22  Yes Pollyann Savoy, MD  predniSONE (STERAPRED UNI-PAK 21 TAB) 10 MG (21) TBPK tablet 10mg  Tabs, 6 day taper. Use as directed 10/31/22  Yes Pollyann Savoy, MD  acetaminophen (TYLENOL) 325 MG tablet Take 2 tablets (650 mg total) by mouth every 6 (six) hours as needed. 10/18/22   Sloan Leiter, DO  cefdinir (OMNICEF) 300 MG capsule Take 1 capsule (300 mg total) by mouth 2 (two) times daily. 12/21/20   Elson Areas, PA-C  guaiFENesin-dextromethorphan (ROBITUSSIN DM) 100-10 MG/5ML syrup Take 5 mLs by mouth every 4 (four) hours as needed for cough. 10/18/22   Sloan Leiter, DO  ibuprofen (ADVIL) 600 MG tablet Take 1 tablet (600 mg total) by mouth every 6 (six) hours as needed. 10/18/22   Tanda Rockers A, DO  lidocaine (LIDODERM) 5 % Place 1 patch onto the skin daily as needed. Remove & Discard patch within 12 hours or as directed by MD 10/18/22   Tanda Rockers A, DO  ondansetron (ZOFRAN ODT) 4 MG disintegrating tablet Take 1 tablet (4 mg total) by mouth every 8 (eight) hours as needed for nausea or vomiting. 12/21/20   Elson Areas, PA-C  oxyCODONE (ROXICODONE) 5 MG immediate release tablet Take 1  tablet (5 mg total) by mouth every 4 (four) hours as needed for severe pain. 10/18/22   Sloan Leiter, DO     Allergies    Pistachio nut (diagnostic) and Latex   Review of Systems   Review of Systems Please see HPI for pertinent positives and negatives  Physical Exam BP 133/89   Pulse 65   Temp 97.7 F (36.5 C)   Resp 18   Ht 5\' 6"  (1.676 m)   Wt (!) 145.2 kg   LMP 10/25/2022 (Exact Date)   SpO2 100%   BMI 51.65 kg/m   Physical Exam Vitals and nursing note reviewed.  Constitutional:      Appearance: Normal appearance.  HENT:     Head: Normocephalic and atraumatic.     Nose: Nose normal.     Mouth/Throat:     Mouth: Mucous membranes are moist.  Eyes:     Extraocular Movements: Extraocular movements intact.     Conjunctiva/sclera: Conjunctivae normal.  Cardiovascular:     Rate and Rhythm: Normal rate.  Pulmonary:     Effort: Pulmonary effort is normal.  Abdominal:     General: Abdomen is flat.     Palpations: Abdomen is soft.     Tenderness: There is no abdominal tenderness.  Musculoskeletal:        General: Tenderness (L lumbar paraspinal muscles and L sciatic  notich) present. No swelling. Normal range of motion.     Cervical back: Neck supple.  Skin:    General: Skin is warm and dry.  Neurological:     General: No focal deficit present.     Mental Status: She is alert and oriented to person, place, and time.     Sensory: No sensory deficit.     Motor: No weakness.     Gait: Gait normal.  Psychiatric:        Mood and Affect: Mood normal.     ED Results / Procedures / Treatments   EKG None  Procedures Procedures  Medications Ordered in the ED Medications  ketorolac (TORADOL) 30 MG/ML injection 30 mg (has no administration in time range)  predniSONE (DELTASONE) tablet 60 mg (has no administration in time range)  ibuprofen (ADVIL) tablet 800 mg (800 mg Oral Given 10/30/22 2023)    Initial Impression and Plan  Patient here with symptoms consistent  with sciatica, no red flags. Labs done in triage show unremarkable CBC, BMP, and HCG. UA with some blood but she is on her menses and symptoms not consistent with renal colic. No further workup needed. Will give Toradol here, begin steroids. Rx for Naprosyn, Robaxin, pred-pack and outpatient Spine follow up.   ED Course       MDM Rules/Calculators/A&P Medical Decision Making Problems Addressed: Sciatica, left side: acute illness or injury  Amount and/or Complexity of Data Reviewed Labs: ordered. Decision-making details documented in ED Course.  Risk Prescription drug management.     Final Clinical Impression(s) / ED Diagnoses Final diagnoses:  Sciatica, left side    Rx / DC Orders ED Discharge Orders          Ordered    naproxen (NAPROSYN) 500 MG tablet  2 times daily        10/31/22 0054    methocarbamol (ROBAXIN) 500 MG tablet  2 times daily        10/31/22 0054    predniSONE (STERAPRED UNI-PAK 21 TAB) 10 MG (21) TBPK tablet        10/31/22 0054             Pollyann Savoy, MD 10/31/22 [phone]

## 2022-10-31 NOTE — ED Notes (Signed)
Discharge instructions discussed with pt. Pt verbalized understanding. Pt stable and ambulatory.

## 2023-06-28 ENCOUNTER — Emergency Department (HOSPITAL_BASED_OUTPATIENT_CLINIC_OR_DEPARTMENT_OTHER)

## 2023-06-28 ENCOUNTER — Other Ambulatory Visit: Payer: Self-pay

## 2023-06-28 ENCOUNTER — Emergency Department (HOSPITAL_BASED_OUTPATIENT_CLINIC_OR_DEPARTMENT_OTHER): Admission: EM | Admit: 2023-06-28 | Discharge: 2023-06-28 | Disposition: A | Discharge status: Home / Self Care

## 2023-06-28 DIAGNOSIS — Z91040 Latex allergy status: Secondary | ICD-10-CM | POA: Diagnosis not present

## 2023-06-28 DIAGNOSIS — L83 Acanthosis nigricans: Secondary | ICD-10-CM | POA: Diagnosis not present

## 2023-06-28 DIAGNOSIS — M25572 Pain in left ankle and joints of left foot: Secondary | ICD-10-CM | POA: Diagnosis present

## 2023-06-28 DIAGNOSIS — M7752 Other enthesopathy of left foot: Secondary | ICD-10-CM | POA: Insufficient documentation

## 2023-06-28 DIAGNOSIS — R590 Localized enlarged lymph nodes: Secondary | ICD-10-CM | POA: Diagnosis not present

## 2023-06-28 NOTE — ED Provider Notes (Signed)
 [place] EMERGENCY DEPARTMENT AT [facility] Provider Note   CSN: [id number] Arrival date & time: 06/28/23  1032     History  Chief Complaint  Patient presents with   Multiple Complaints    Beth Watts is a 38 y.o. female with no significant past medical history presents with concern for pain to her left ankle that has been ongoing for past month.  States she had a couple injuries to it.  States she tripped over a lamp, closed it in a car door, and dropped another object on it.  Also reports she has been trying to run which is new for her.  Pain has started after increasing her physical activity.  Denies any numbness or tingling to the ankle.  Has still been able to walk on the ankle.  She also reports a "bump" in the right upper thigh/groin region that has been there for the past month.  This has been constant, does not come and go denies any overlying skin changes.  Also reporting a rash on her neck that has been there for months.  Denies any pain or itching associated with the rash.  HPI     Home Medications Prior to Admission medications   Medication Sig Start Date End Date Taking? Authorizing Provider  acetaminophen  (TYLENOL ) 325 MG tablet Take 2 tablets (650 mg total) by mouth every 6 (six) hours as needed. 10/18/22   Teddi Favors, DO  cefdinir  (OMNICEF ) 300 MG capsule Take 1 capsule (300 mg total) by mouth 2 (two) times daily. 12/21/20   Sofia, Leslie K, PA-C  guaiFENesin -dextromethorphan (ROBITUSSIN DM) 100-10 MG/5ML syrup Take 5 mLs by mouth every 4 (four) hours as needed for cough. 10/18/22   Teddi Favors, DO  ibuprofen  (ADVIL ) 600 MG tablet Take 1 tablet (600 mg total) by mouth every 6 (six) hours as needed. 10/18/22   Teddi Favors, DO  lidocaine  (LIDODERM ) 5 % Place 1 patch onto the skin daily as needed. Remove & Discard patch within 12 hours or as directed by MD 10/18/22   Teddi Favors, DO  methocarbamol  (ROBAXIN ) 500 MG tablet Take 1 tablet (500 mg total)  by mouth 2 (two) times daily. 10/31/22   Charmayne Cooper, MD  naproxen  (NAPROSYN ) 500 MG tablet Take 1 tablet (500 mg total) by mouth 2 (two) times daily. 10/31/22   Charmayne Cooper, MD  ondansetron  (ZOFRAN  ODT) 4 MG disintegrating tablet Take 1 tablet (4 mg total) by mouth every 8 (eight) hours as needed for nausea or vomiting. 12/21/20   Sofia, Leslie K, PA-C  oxyCODONE  (ROXICODONE ) 5 MG immediate release tablet Take 1 tablet (5 mg total) by mouth every 4 (four) hours as needed for severe pain. 10/18/22   Teddi Favors, DO  predniSONE  (STERAPRED UNI-PAK 21 TAB) 10 MG (21) TBPK tablet 10mg  Tabs, 6 day taper. Use as directed 10/31/22   Charmayne Cooper, MD      Allergies    Pistachio nut (diagnostic) and Latex    Review of Systems   Review of Systems  Skin:  Positive for color change.    Physical Exam Updated Vital Signs BP (!) 148/125   Pulse 72   Temp (!) 97.4 F (36.3 C) (Oral)   Resp 18   SpO2 100%  Physical Exam Vitals and nursing note reviewed.  Constitutional:      General: She is not in acute distress.    Appearance: She is well-developed. She is obese.  HENT:  Head: Normocephalic and atraumatic.  Eyes:     Conjunctiva/sclera: Conjunctivae normal.  Cardiovascular:     Rate and Rhythm: Normal rate and regular rhythm.     Heart sounds: No murmur heard.    Comments: Dorsalis pedis pulse 1+ bilaterally Pulmonary:     Effort: Pulmonary effort is normal. No respiratory distress.     Breath sounds: Normal breath sounds.  Abdominal:     Palpations: Abdomen is soft.     Tenderness: There is no abdominal tenderness.  Musculoskeletal:        General: No swelling.     Cervical back: Neck supple.     Comments: Right lower extremity: General No erythema, edema, contusions, open wounds   Palpation Non tender over the knee Nontender along the tibia and fibula Nontender on the lateral and medial malleolus Non-tender along the ATFL, CFL, PTFL, achilles  tendon Nontender along the first through fifth metatarsals and phalanges  ROM Full ankle flexion, extension, inversion and eversion Reports pain in medial side of calf extending down into the ankle with walking  Sensation: Sensation intact throughout the lower extremity    Right Groin: I do not appreciate any nodules/bumps or other abnormality on palpation of the skin in the right groin region.  No hernia appreciated.  No overlying skin changes.  Able to fully flex and extend at the hips bilaterally.   Skin:    General: Skin is warm and dry.     Capillary Refill: Capillary refill takes less than 2 seconds.     Comments: Acanthosis nigricans on the posterior neck Has darkened skin macules on the anterior neck and in between the breasts that also seems consistent with acanthosis nigricans  No overlying skin changes in the right groin region or right thigh  Neurological:     Mental Status: She is alert.  Psychiatric:        Mood and Affect: Mood normal.     ED Results / Procedures / Treatments   Labs (all labs ordered are listed, but only abnormal results are displayed) Labs Reviewed - No data to display  EKG None  Radiology US  RT LOWER EXTREM LTD SOFT TISSUE NON VASCULAR Result Date: 06/28/2023 CLINICAL DATA:  Of area within right groin for 1.5 months. "Pea-sized. " EXAM: ULTRASOUND RIGHT LOWER EXTREMITY LIMITED TECHNIQUE: Ultrasound examination of the lower extremity soft tissues was performed in the area of clinical concern. COMPARISON:  None Available. FINDINGS: In the area of interest of the right inguinal region, the palpable abnormality corresponds to an oval, well-circumscribed, normal appearing lymph node with fatty hilum, cortex measuring 3 mm in thickness, and normal vascularity at the hilum. This measures 1.2 x 0.6 x 1.0 cm. IMPRESSION: The palpable abnormality corresponds to a normal appearing right inguinal lymph node. Electronically Signed   By: Bertina Broccoli M.D.    On: 06/28/2023 13:28    Procedures Procedures    Medications Ordered in ED Medications - No data to display  ED Course/ Medical Decision Making/ A&P                                 Medical Decision Making Amount and/or Complexity of Data Reviewed Radiology: ordered.     Differential diagnosis includes but is not limited to fracture, dislocation, sprain, tendinitis, contact dermatitis, acanthosis nigricans, cyst, hernia  ED Course:  Upon initial evaluation, patient is well-appearing, stable vitals aside from mildly elevated blood pressure  143/73.  She is reporting some pain in her left ankle that has been ongoing for past month.  Pain located in the medial aspect of the calf running down into the medial aspect of the ankle.  Pain only reproducible with walking.  No bony tenderness palpation of the lower extremity, full range of motion of the left ankle. 5/5 strength of the bilateral lower extremities, neurovascular intact of the lower extremities.  No concern for fracture or dislocation at this time.  No concern for septic arthritis or gout given no overlying skin changes, able to perform ROM without difficulty.  Given description of pain and that it has been ongoing for past month after increasing physical activity, suspect a tendinitis. She also notes concern for a rash present on her neck and in between her breasts.  On exam, this does appear consistent with acanthosis nigricans.  Explained that this is something that may be due to elevated blood sugars, and she will need to follow-up with her PCP for further management and evaluation of blood glucose.   Imaging Studies ordered: I ordered imaging studies including soft tissue ultrasound I independently visualized the imaging with scope of interpretation limited to determining acute life threatening conditions related to emergency care.  Ultrasound showed inguinal lymph node I agree with the radiologist interpretation   Upon  re-evaluation, patient still well-appearing.  Discussed that her ultrasound showed the bump she is concerned about is a inguinal lymph node, no concerning features.  Stable and appropriate for discharge home.    Impression: Left ankle tendinitis Inguinal lymph node Acanthosis Nigricans   Disposition:  The patient was discharged home with instructions to follow-up with PCP within the next month for her acanthosis nigricans and further evaluation of her blood sugars and blood pressure management.  Follow-up with orthopedics within the next week for further management of her tendinitis.  Tylenol  and ibuprofen  as needed for pain. Return precautions given.     This chart was dictated using voice recognition software, Dragon. Despite the best efforts of this provider to proofread and correct errors, errors may still occur which can change documentation meaning.          Final Clinical Impression(s) / ED Diagnoses Final diagnoses:  Acanthosis nigricans  Left ankle tendonitis  Inguinal lymphadenopathy    Rx / DC Orders ED Discharge Orders     None         Rexie Catena, PA-C 06/28/23 1341    Carin Charleston, MD 06/28/23 1555

## 2023-06-28 NOTE — ED Triage Notes (Signed)
 Multiple complaints. - left ankle pain x "months". - "bump" in R groin. - Rash of neck for months - chronic back pain.    No acute distress.

## 2023-06-28 NOTE — Discharge Instructions (Addendum)
 It appears that you have a tendinitis of one of the muscles in your left lower extremity/ankle.  Avoid movements and activities that are painful. Elevate the area of injury and wrap with an elastic bandage or tape to help with swelling.  You may take up to 1000mg  of tylenol  every 6 hours as needed for pain. Do not take more then 4g per day.   You may use up to 600mg  ibuprofen  every 6 hours as needed for pain.  Do not exceed 2.4g of ibuprofen  per day.  Please follow-up with the orthopedic office listed below within the next week for follow-up.  You may also benefit from physical therapy.  You may self-referral or get a referral from the orthopedic office.  Gradually return to activity as pain allows. Try to engage in non-painful types of physical activity/exercise to increase blood flow to your area of injury.  The rash on the back of your neck and chest is called acanthosis nigricans and is commonly due to elevated blood sugars/diabetes.  Please follow-up with your PCP within the next month for evaluation of your blood sugars.  The ultrasound showed that the bump you have noticed in your groin is an enlarged inguinal lymph node.  Lymph nodes can enlarge occasionally and this is not cause for concern.  If the lymph node swelling does not go down within the next month, I would then follow-up with your PCP.  Your blood pressure was elevated here today at 143/73. Aim to get 30 minutes of exercise daily and limit intake of processed/fast foods. Please take and record your blood pressures at home. Take them at the same time every day. Follow up with your PCP within the next month to discuss if you may need treatment for your blood pressure.   Return to the ER for any other emergent concerns.

## 2023-07-04 IMAGING — DX DG CHEST 2V
2 series · 2 of 2 positions shown · non-contrast
Comparison: June 21, 2010

CLINICAL DATA: Cough, congestion and fever.

EXAM:
CHEST - 2 VIEW

[chest pa]
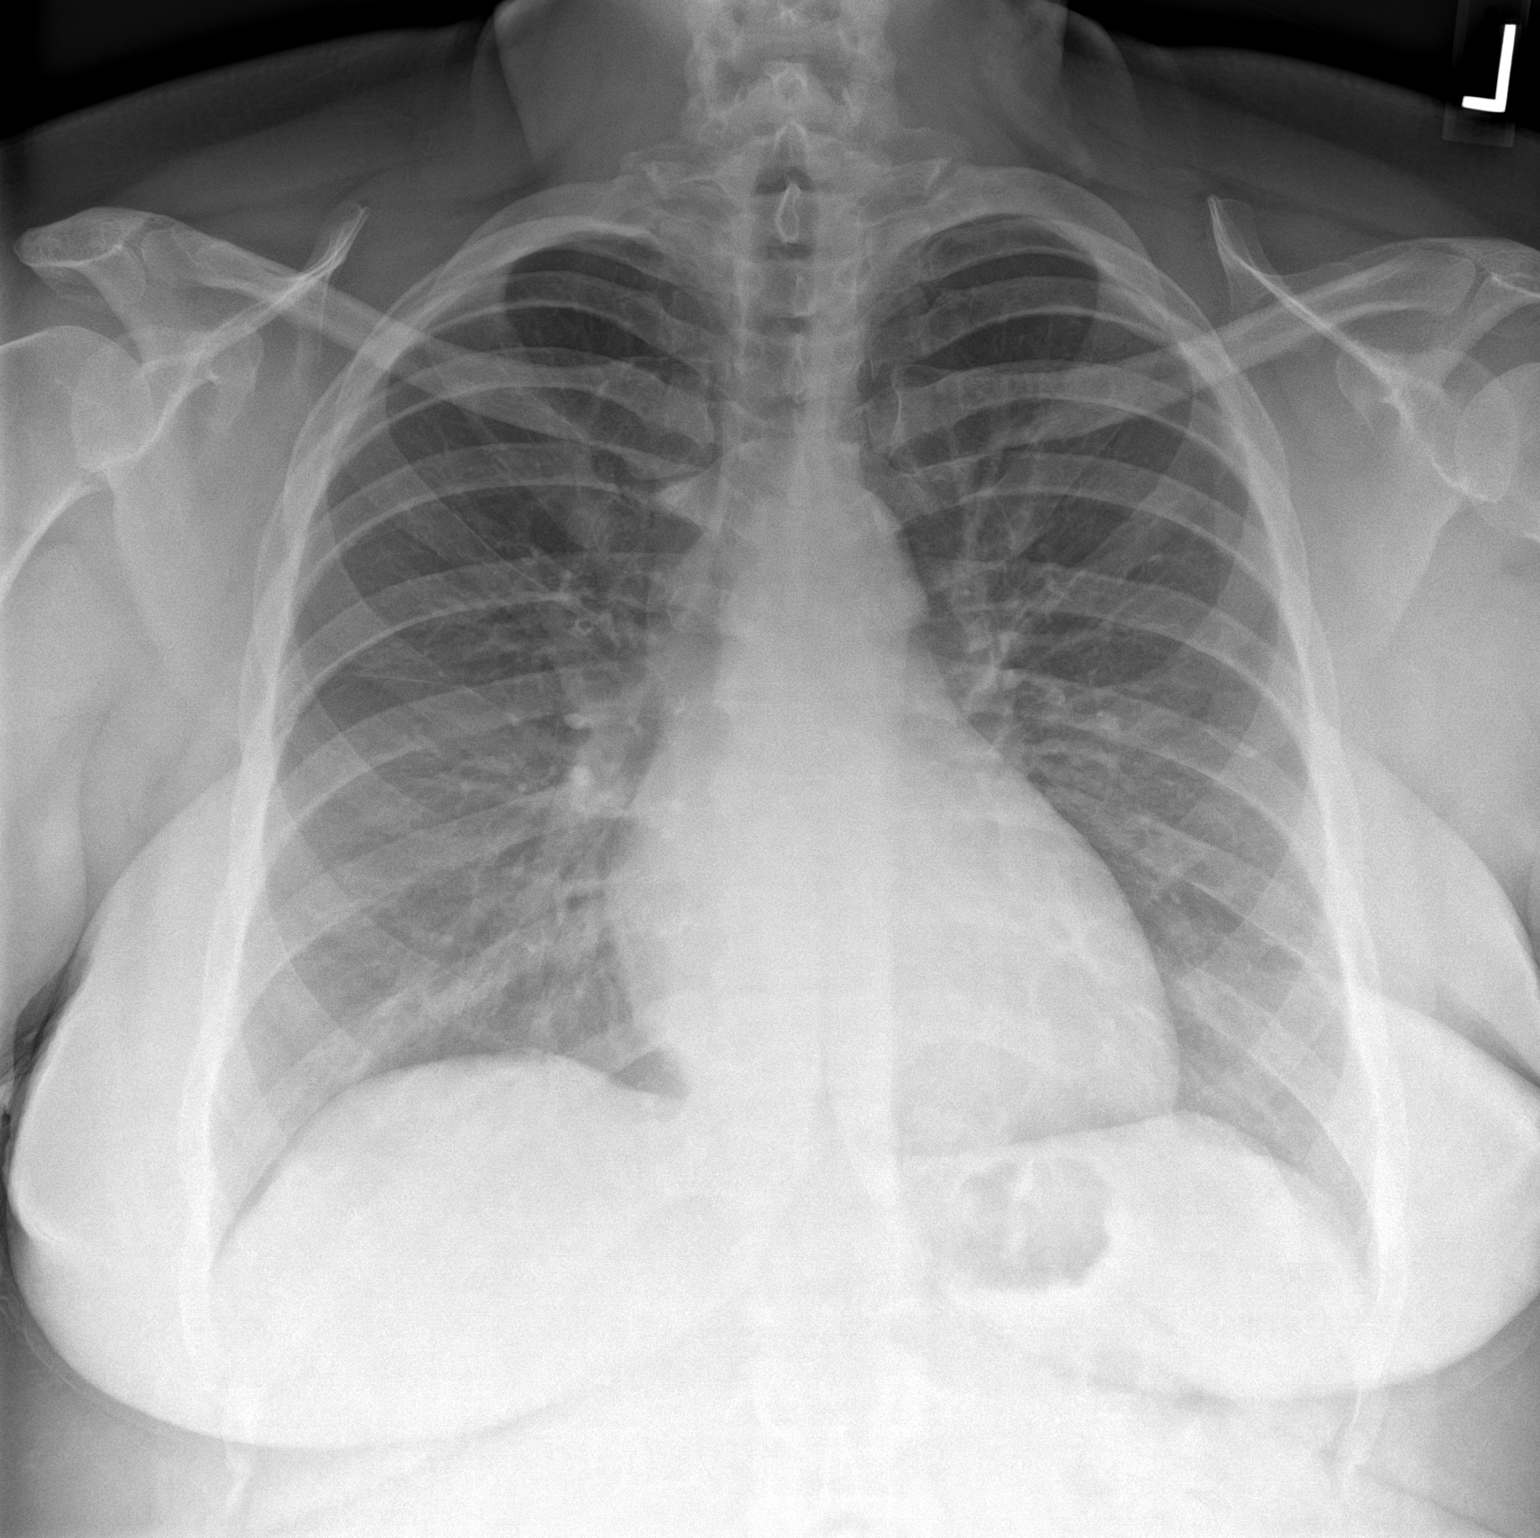

[chest lat]
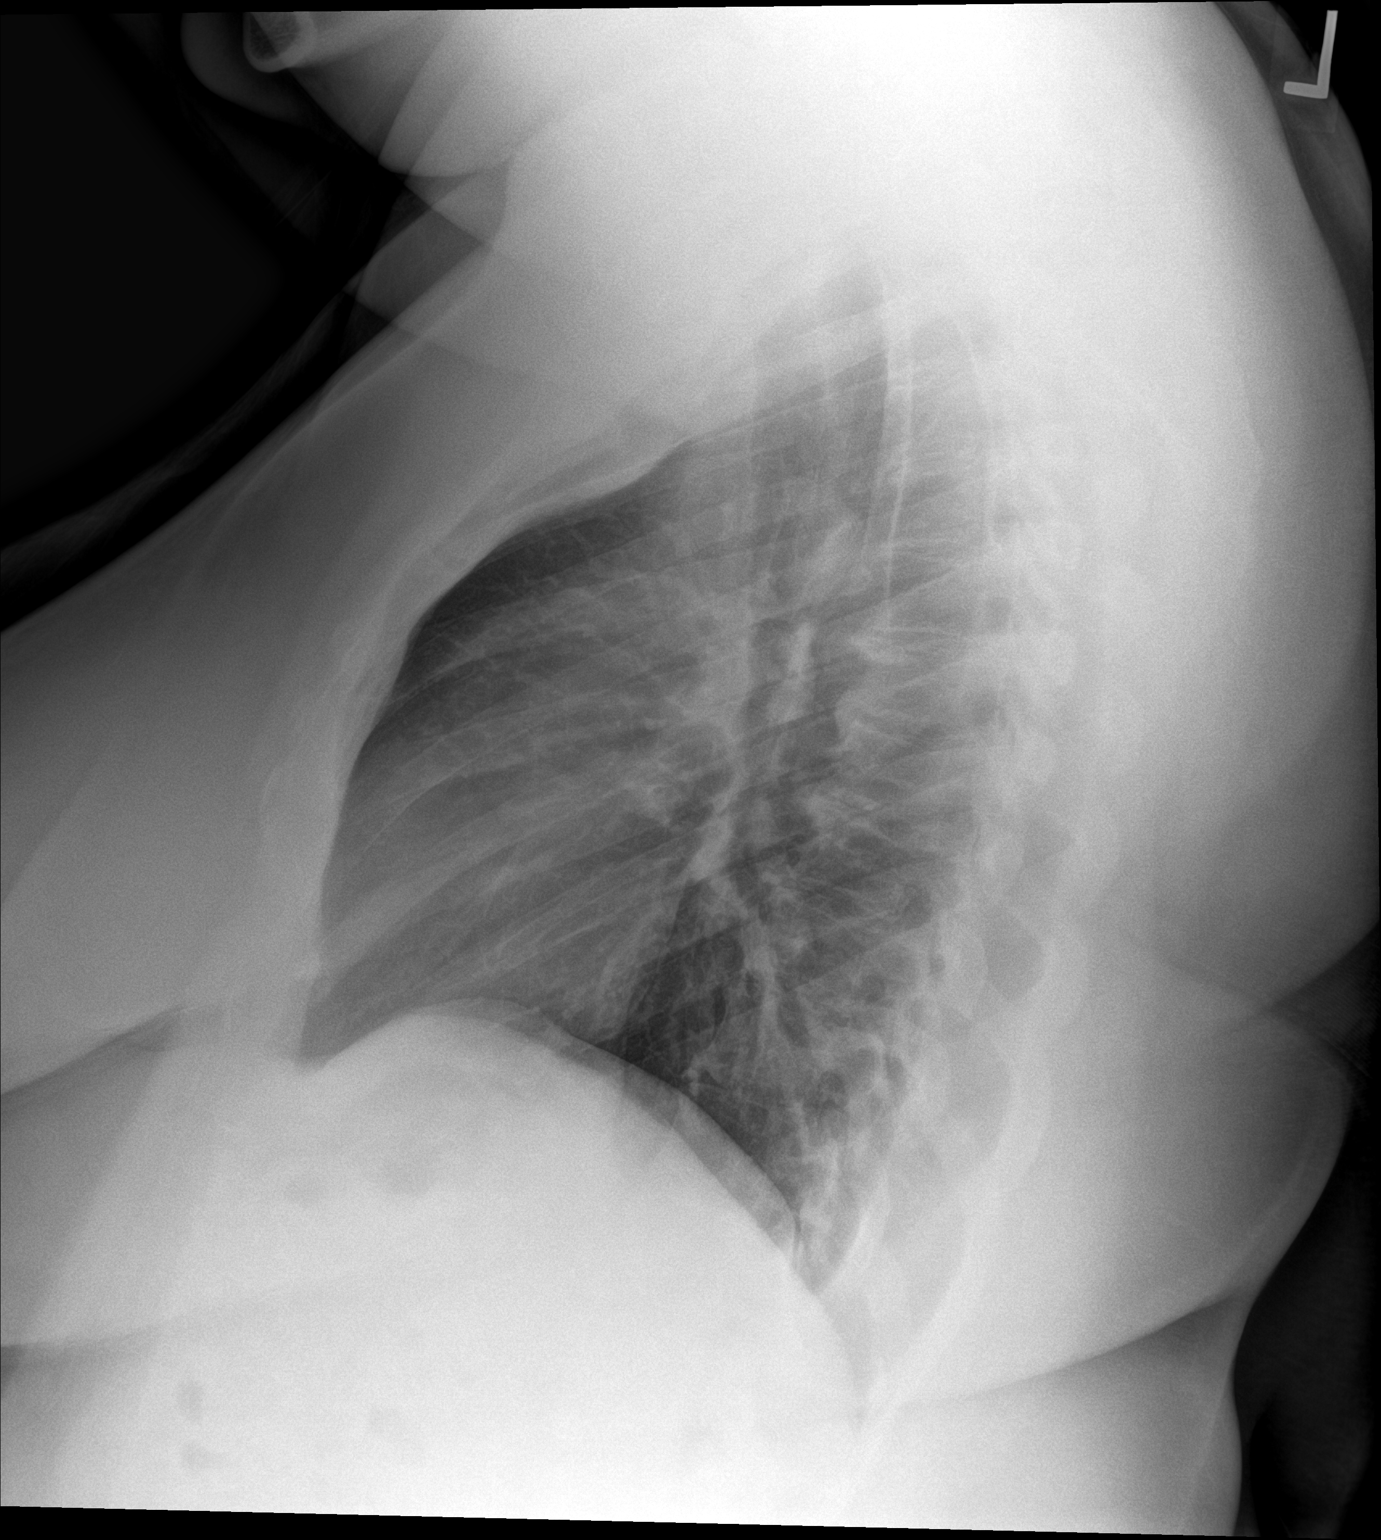

[2 of 2 positions shown; findings below may reference images not displayed]

FINDINGS: The heart size and mediastinal contours are within normal limits.
Both lungs are clear. The visualized skeletal structures are stable.
IMPRESSION: No active cardiopulmonary disease.

## 2023-07-28 ENCOUNTER — Encounter: Payer: Self-pay | Admitting: Physical Therapy

## 2023-07-28 ENCOUNTER — Ambulatory Visit: Attending: Primary Care | Admitting: Physical Therapy

## 2023-07-28 ENCOUNTER — Other Ambulatory Visit: Payer: Self-pay

## 2023-07-28 DIAGNOSIS — R2689 Other abnormalities of gait and mobility: Secondary | ICD-10-CM | POA: Insufficient documentation

## 2023-07-28 DIAGNOSIS — M25572 Pain in left ankle and joints of left foot: Secondary | ICD-10-CM | POA: Insufficient documentation

## 2023-07-28 NOTE — Therapy (Signed)
 OUTPATIENT PHYSICAL THERAPY LOWER EXTREMITY EVALUATION   Patient Name: Beth Watts MRN: [id number] DOB:1985/10/26, 38 y.o., female Today's Date: 07/28/2023  END OF SESSION:  PT End of Session - 07/28/23 1833     Visit Number 1    Number of Visits 1    PT Start Time 1700    PT Stop Time 1745    PT Time Calculation (min) 45 min    Activity Tolerance Patient tolerated treatment well    Behavior During Therapy WFL for tasks assessed/performed          Past Medical History:  Diagnosis Date   Allergy    PCOS (polycystic ovarian syndrome)    Past Surgical History:  Procedure Laterality Date   NO PAST SURGERIES     There are no active problems to display for this patient.   PCP: Ardia Kraft, NP  REFERRING PROVIDER: Ardia Kraft, NP  REFERRING DIAG: no specified dx, assumed L ankle dysfunction from ED note  Rationale for Evaluation and Treatment: Rehabilitation  THERAPY DIAG:  Pain in left ankle and joints of left foot  Other abnormalities of gait and mobility  PERTINENT HISTORY: No active problems per pt chart  WEIGHT BEARING RESTRICTIONS: No  FALLS:  Has patient fallen in last 6 months? Yes. Number of falls 1  LIVING ENVIRONMENT: Lives with: lives with their family and lives alone Lives in: House/apartment Stairs: Yes: Internal: 12 steps; none Has following equipment at home: None  OCCUPATION: works in Designer, jewellery   PRECAUTIONS: None ---------------------------------------------------------------------------------------------  SUBJECTIVE:   SUBJECTIVE STATEMENT: Eval statement 07/28/2023: L ankle pain onset past 2 months or so, has gotten a little bit better. Started when pt tripped over Spider lamp, then closed the L ankle in her car door.   RED FLAGS: None   PLOF: Independent  PATIENT GOALS: stop the pain  NEXT MD VISIT: jul  2nd ---------------------------------------------------------------------------------------------  OBJECTIVE:  Note: Objective measures were completed at Evaluation unless otherwise noted.  DIAGNOSTIC FINDINGS: no recent imaging  PATIENT SURVEYS:  LEFS  Extreme difficulty/unable (0), Quite a bit of difficulty (1), Moderate difficulty (2), Little difficulty (3), No difficulty (4) Survey date:    Any of your usual work, housework or school activities   2. Usual hobbies, recreational or sporting activities   3. Getting into/out of the bath   4. Walking between rooms   5. Putting on socks/shoes   6. Squatting    7. Lifting an object, like a bag of groceries from the floor   8. Performing light activities around your home   9. Performing heavy activities around your home   10. Getting into/out of a car   11. Walking 2 blocks   12. Walking 1 mile   13. Going up/down 10 stairs (1 flight)   14. Standing for 1 hour   15.  sitting for 1 hour   16. Running on even ground   17. Running on uneven ground   18. Making sharp turns while running fast   19. Hopping    20. Rolling over in bed   Score total:  47     COGNITION: Overall cognitive status: Within functional limits for tasks assessed     SENSATION: WFL  EDEMA:  Moderate swelling @LLE   MUSCLE LENGTH:   POSTURE: rounded shoulders and forward head  PALPATION: Tenderness to posterior tib  LOWER EXTREMITY ROM:  Active ROM Right eval Left eval  Hip flexion    Hip extension  Hip abduction    Hip adduction    Hip internal rotation    Hip external rotation    Knee flexion    Knee extension    Ankle dorsiflexion WFL 10d!  Ankle plantarflexion [facility]!  Ankle inversion [facility]!  Ankle eversion WFL WFL   (Blank rows = not tested)  ! Indicates pain with testing  LOWER EXTREMITY MMT:  MMT Right eval Left eval  Hip flexion    Hip extension    Hip abduction    Hip adduction    Hip internal rotation    Hip  external rotation    Knee flexion    Knee extension    Ankle dorsiflexion 5 4-  Ankle plantarflexion 4 4  Ankle inversion 4+ 4-  Ankle eversion 4+ 4+   (Blank rows = not tested)  ! Indicates pain with testing  LOWER EXTREMITY SPECIAL TESTS:  Ankle special tests: Anterior drawer test: negative  FUNCTIONAL TESTS:  30 seconds chair stand test 9, pt reported keeping pressure off L ankle  GAIT: Distance walked: 140ft Assistive device utilized: None Level of assistance: Complete Independence Comments: WFL gait quality                                                                                                                                [facility] Adult PT Treatment:                                                DATE: 07/28/2023   Self Care: Pt education, detailed below POC discussion    PATIENT EDUCATION:  Education details: Pt received education regarding HEP performance, ADL performance, functional activity tolerance, impairment education, appropriate performance of therapeutic activities. Person educated: Patient Education method: Explanation, Demonstration, Tactile cues, Verbal cues, and Handouts Education comprehension: verbalized understanding and returned demonstration  HOME EXERCISE PROGRAM: Access Code: [facility] URL: [email or web address] Date: 07/28/2023 Prepared by: Albesa Huguenin  Exercises - Seated Ankle Inversion with Resistance and Legs Crossed  - 1 x daily - 7 x weekly - 2 sets - 12 reps - 2s hold - Heel Toe Raises with Counter Support  - 1 x daily - 7 x weekly - 2-3 sets - 12 reps - 5s hold - Heel raises with counter support Only Put as much weight as you can tolerate through L foot   - 1 x daily - 7 x weekly - 2-3 sets - 12 reps - 5s hold - Gastroc Stretch on Wall  - 1 x daily - 7 x weekly - 2 sets - 1 reps - 31m hold ---------------------------------------------------------------------------------------------  ASSESSMENT:  CLINICAL  IMPRESSION: Eval impression (07/28/2023): Pt. attended today's physical therapy session for evaluation of L ankle dysfunction. Pt has complaints of L ankle pain and weakness following a a stumble she took  on a spider lamp a few months ago. Pt has notable deficits with ankle AROM, general strength, and stability.  Signs and symptoms are concurrent with posterior tibialis strain. Pt would benefit from therapeutic focus on progressive strengthening, mobility work and stability for ambulation.  Treatment performed today focused on pt education detailed in obj. Pt demonstrated good understanding of education provided. required minimal v/t cues and no assistance for appropriate performance with today's activities. Pt requires the intervention of skilled outpatient physical therapy to address the aforementioned deficits and progress towards a functional level in line with therapeutic goals. However, Pt requested one time visit.   OBJECTIVE IMPAIRMENTS: decreased activity tolerance, decreased coordination, decreased mobility, difficulty walking, decreased ROM, decreased strength, decreased safety awareness, improper body mechanics, and pain.   ACTIVITY LIMITATIONS: carrying, lifting, squatting, and locomotion level  PARTICIPATION LIMITATIONS: shopping and community activity  PERSONAL FACTORS: Fitness and Time since onset of injury/illness/exacerbation are also affecting patient's functional outcome.   REHAB POTENTIAL: Good  CLINICAL DECISION MAKING: Stable/uncomplicated  EVALUATION COMPLEXITY: Low   GOALS: Goals reviewed with patient? YES  SHORT TERM GOALS: Target date: 07/28/2023  Pt will be independent with administered HEP to demonstrate the competency necessary for long term managemnet of symptoms at home.  Baseline: Goal status: INITIAL  --------------------------------------------------------------------------------------------- PLAN:  PT FREQUENCY: eval only  PT DURATION: one  visit  PLANNED INTERVENTIONS: 97110-Therapeutic exercises, 97530- Therapeutic activity, 97112- Neuromuscular re-education, 97535- Self Care, 16109- Manual therapy, [phone]- Gait training, Patient/Family education, Balance training, Stair training, Taping, and Joint mobilization  PLAN FOR NEXT SESSION: one time visit  Albesa Huguenin, PT, DPT 07/28/2023, 6:33 PM

## 2023-08-18 ENCOUNTER — Encounter (HOSPITAL_BASED_OUTPATIENT_CLINIC_OR_DEPARTMENT_OTHER): Payer: Self-pay

## 2023-08-18 ENCOUNTER — Emergency Department (HOSPITAL_BASED_OUTPATIENT_CLINIC_OR_DEPARTMENT_OTHER)
Admission: EM | Admit: 2023-08-18 | Discharge: 2023-08-18 | Disposition: A | Discharge status: Home / Self Care | Attending: Emergency Medicine | Admitting: Emergency Medicine

## 2023-08-18 ENCOUNTER — Other Ambulatory Visit: Payer: Self-pay

## 2023-08-18 ENCOUNTER — Emergency Department (HOSPITAL_BASED_OUTPATIENT_CLINIC_OR_DEPARTMENT_OTHER): Admitting: Radiology

## 2023-08-18 DIAGNOSIS — W228XXA Striking against or struck by other objects, initial encounter: Secondary | ICD-10-CM | POA: Diagnosis not present

## 2023-08-18 DIAGNOSIS — S93509A Unspecified sprain of unspecified toe(s), initial encounter: Secondary | ICD-10-CM

## 2023-08-18 DIAGNOSIS — S93501A Unspecified sprain of right great toe, initial encounter: Secondary | ICD-10-CM | POA: Diagnosis not present

## 2023-08-18 DIAGNOSIS — Z91040 Latex allergy status: Secondary | ICD-10-CM | POA: Diagnosis not present

## 2023-08-18 DIAGNOSIS — M79674 Pain in right toe(s): Secondary | ICD-10-CM | POA: Diagnosis present

## 2023-08-18 MED ORDER — IBUPROFEN 800 MG PO TABS
800.0000 mg | ORAL_TABLET | Freq: Once | ORAL | Status: AC
  Administered 2023-08-18: 800 mg via ORAL
  Filled 2023-08-18: qty 1

## 2023-08-18 NOTE — ED Triage Notes (Signed)
 Stubbed right great toe on the 07/04. Continued pain and bleeding. Hit again on coffee table yesterday.

## 2023-08-18 NOTE — Discharge Instructions (Signed)
 Recommend rest, ice, elevation of the extremity, NSAIDs for pain control, postop shoe has been provided for comfort, can weight-bear as tolerated as your x-ray imaging showed no evidence of fracture or dislocation.  Follow-up with your PCP to ensure resolution.

## 2023-08-18 NOTE — ED Provider Notes (Signed)
 [place] EMERGENCY DEPARTMENT AT [facility] Provider Note   CSN: [id number] Arrival date & time: 08/18/23  9240     Patient presents with: Toe Injury   Beth Watts is a 38 y.o. female.   HPI   38 year old female presenting to the emergency department with toe pain.  The patient stubbed her toe on 7/4 and has subsequently struck her toe 3-4 times over the past several days most recently on a coffee table yesterday.  She endorses swelling and pain in the right great toe.  She had had some bleeding around the nail a few days ago but has not had any since.  She has had some pain with weightbearing and pushing on the gas pedal of her car.  She denies any foot drop, numbness or weakness in the toe.  Pain is primarily located to the first digit of the foot on the right.  Prior to Admission medications   Medication Sig Start Date End Date Taking? Authorizing Provider  acetaminophen  (TYLENOL ) 325 MG tablet Take 2 tablets (650 mg total) by mouth every 6 (six) hours as needed. 10/18/22   Elnor Jayson LABOR, DO  cefdinir  (OMNICEF ) 300 MG capsule Take 1 capsule (300 mg total) by mouth 2 (two) times daily. 12/21/20   Sofia, Leslie K, PA-C  guaiFENesin -dextromethorphan (ROBITUSSIN DM) 100-10 MG/5ML syrup Take 5 mLs by mouth every 4 (four) hours as needed for cough. 10/18/22   Elnor Jayson LABOR, DO  ibuprofen  (ADVIL ) 600 MG tablet Take 1 tablet (600 mg total) by mouth every 6 (six) hours as needed. 10/18/22   Elnor Jayson LABOR, DO  lidocaine  (LIDODERM ) 5 % Place 1 patch onto the skin daily as needed. Remove & Discard patch within 12 hours or as directed by MD 10/18/22   Elnor Jayson LABOR, DO  methocarbamol  (ROBAXIN ) 500 MG tablet Take 1 tablet (500 mg total) by mouth 2 (two) times daily. 10/31/22   Roselyn Carlin NOVAK, MD  naproxen  (NAPROSYN ) 500 MG tablet Take 1 tablet (500 mg total) by mouth 2 (two) times daily. 10/31/22   Roselyn Carlin NOVAK, MD  ondansetron  (ZOFRAN  ODT) 4 MG disintegrating tablet Take 1  tablet (4 mg total) by mouth every 8 (eight) hours as needed for nausea or vomiting. 12/21/20   Sofia, Leslie K, PA-C  oxyCODONE  (ROXICODONE ) 5 MG immediate release tablet Take 1 tablet (5 mg total) by mouth every 4 (four) hours as needed for severe pain. 10/18/22   Elnor Jayson LABOR, DO  predniSONE  (STERAPRED UNI-PAK 21 TAB) 10 MG (21) TBPK tablet 10mg  Tabs, 6 day taper. Use as directed 10/31/22   Roselyn Carlin NOVAK, MD    Allergies: Pistachio nut (diagnostic) and Latex    Review of Systems  All other systems reviewed and are negative.   Updated Vital Signs BP 121/77   Pulse 70   Temp 98.5 F (36.9 C) (Oral)   Resp 16   Ht 5' 7 (1.702 m)   Wt (!) 149.7 kg   LMP 08/11/2023 (Approximate)   SpO2 100%   BMI 51.69 kg/m   Physical Exam Vitals and nursing note reviewed.  Constitutional:      General: She is not in acute distress. HENT:     Head: Normocephalic and atraumatic.  Eyes:     Conjunctiva/sclera: Conjunctivae normal.     Pupils: Pupils are equal, round, and reactive to light.  Cardiovascular:     Rate and Rhythm: Normal rate and regular rhythm.  Pulmonary:     Effort:  Pulmonary effort is normal. No respiratory distress.  Abdominal:     General: There is no distension.     Tenderness: There is no guarding.  Musculoskeletal:        General: Swelling and tenderness present. No deformity.     Cervical back: Neck supple.     Comments: First great toe on the right with tenderness and swelling, no active bleeding or laceration or abrasion, 2+ DP pulses, intact strength and sensation of the foot, no tenderness about the metacarpals  Skin:    Findings: No lesion or rash.  Neurological:     General: No focal deficit present.     Mental Status: She is alert. Mental status is at baseline.     (all labs ordered are listed, but only abnormal results are displayed) Labs Reviewed - No data to display  EKG: None  Radiology: DG Toe Great Right Result Date: 08/18/2023 CLINICAL  DATA:  toe pain EXAM: RIGHT GREAT TOE COMPARISON:  None Available. FINDINGS: No acute fracture or dislocation. There is no evidence of arthropathy or other focal bone abnormality. Soft tissues are unremarkable. No radiopaque foreign body. IMPRESSION: No acute fracture or dislocation. Electronically Signed   By: Rogelia Myers M.D.   On: 08/18/2023 08:50     Procedures   Medications Ordered in the ED  ibuprofen  (ADVIL ) tablet 800 mg (800 mg Oral Given 08/18/23 0837)                                    Medical Decision Making Amount and/or Complexity of Data Reviewed Radiology: ordered.  Risk Prescription drug management.   38 year old female presenting to the emergency department with toe pain.  The patient stubbed her toe on 7/4 and has subsequently struck her toe 3-4 times over the past several days most recently on a coffee table yesterday.  She endorses swelling and pain in the right great toe.  She had had some bleeding around the nail a few days ago but has not had any since.  She has had some pain with weightbearing and pushing on the gas pedal of her car.  She denies any foot drop, numbness or weakness in the toe.  Pain is primarily located to the first digit of the foot on the right.  On arrival, the patient was Vitally stable, physical exam as per above, patient presenting with either toe strain or fracture in the setting of multiple injuries to the toe with accidentally striking the toe.  Patient LMP last week, not concerned for pregnancy, has not taken anything for pain control.  Will administer Motrin  800 mg, placed the patient in a postop shoe for comfort due to pain with weightbearing, obtain x-ray imaging of the great toe on the right.  XR Toe: Negative for fracture or dislocation  Pt advised RICE, post op shoe for comfort, weight bearing as tolerated. Stable for DC.       Final diagnoses:  Sprain of toe, initial encounter    ED Discharge Orders     None           Jerrol Agent, MD 08/18/23 [phone]
